# Patient Record
Sex: Male | Born: 1950 | Race: White | Hispanic: No | Marital: Married | Smoking: Never smoker
Health system: Southern US, Community
[De-identification: ages and names within clinical notes are randomized; demographics above are authoritative.]

## PROBLEM LIST (undated history)

## (undated) DIAGNOSIS — R011 Cardiac murmur, unspecified: Secondary | ICD-10-CM

## (undated) DIAGNOSIS — K219 Gastro-esophageal reflux disease without esophagitis: Secondary | ICD-10-CM

## (undated) DIAGNOSIS — I350 Nonrheumatic aortic (valve) stenosis: Secondary | ICD-10-CM

## (undated) DIAGNOSIS — I10 Essential (primary) hypertension: Secondary | ICD-10-CM

## (undated) DIAGNOSIS — T7840XA Allergy, unspecified, initial encounter: Secondary | ICD-10-CM

## (undated) DIAGNOSIS — I340 Nonrheumatic mitral (valve) insufficiency: Secondary | ICD-10-CM

## (undated) DIAGNOSIS — E785 Hyperlipidemia, unspecified: Secondary | ICD-10-CM

## (undated) HISTORY — DX: Cardiac murmur, unspecified: R01.1

## (undated) HISTORY — DX: Allergy, unspecified, initial encounter: T78.40XA

## (undated) HISTORY — DX: Gastro-esophageal reflux disease without esophagitis: K21.9

## (undated) HISTORY — PX: CARDIAC CATHETERIZATION: SHX172

## (undated) HISTORY — PX: NO PAST SURGERIES: SHX2092

## (undated) HISTORY — PX: CARDIAC VALVE REPLACEMENT: SHX585

---

## 2012-12-03 ENCOUNTER — Ambulatory Visit: Payer: Self-pay

## 2014-03-17 IMAGING — CR DG CHEST 2V
1 series · 2 of 2 positions shown · non-contrast
Comparison: none

REASON FOR EXAM: atypical chest pain
COMMENTS:   LMP: (Male)

PROCEDURE:     MDR - MDR CHEST PA(OR AP) AND LATERAL  - December 03, 2012  [DATE]
RESULT:     Comparison: None

[Series 1: pa · 0.17mm/px · 2 of 2 slices shown]
[im 1/2]
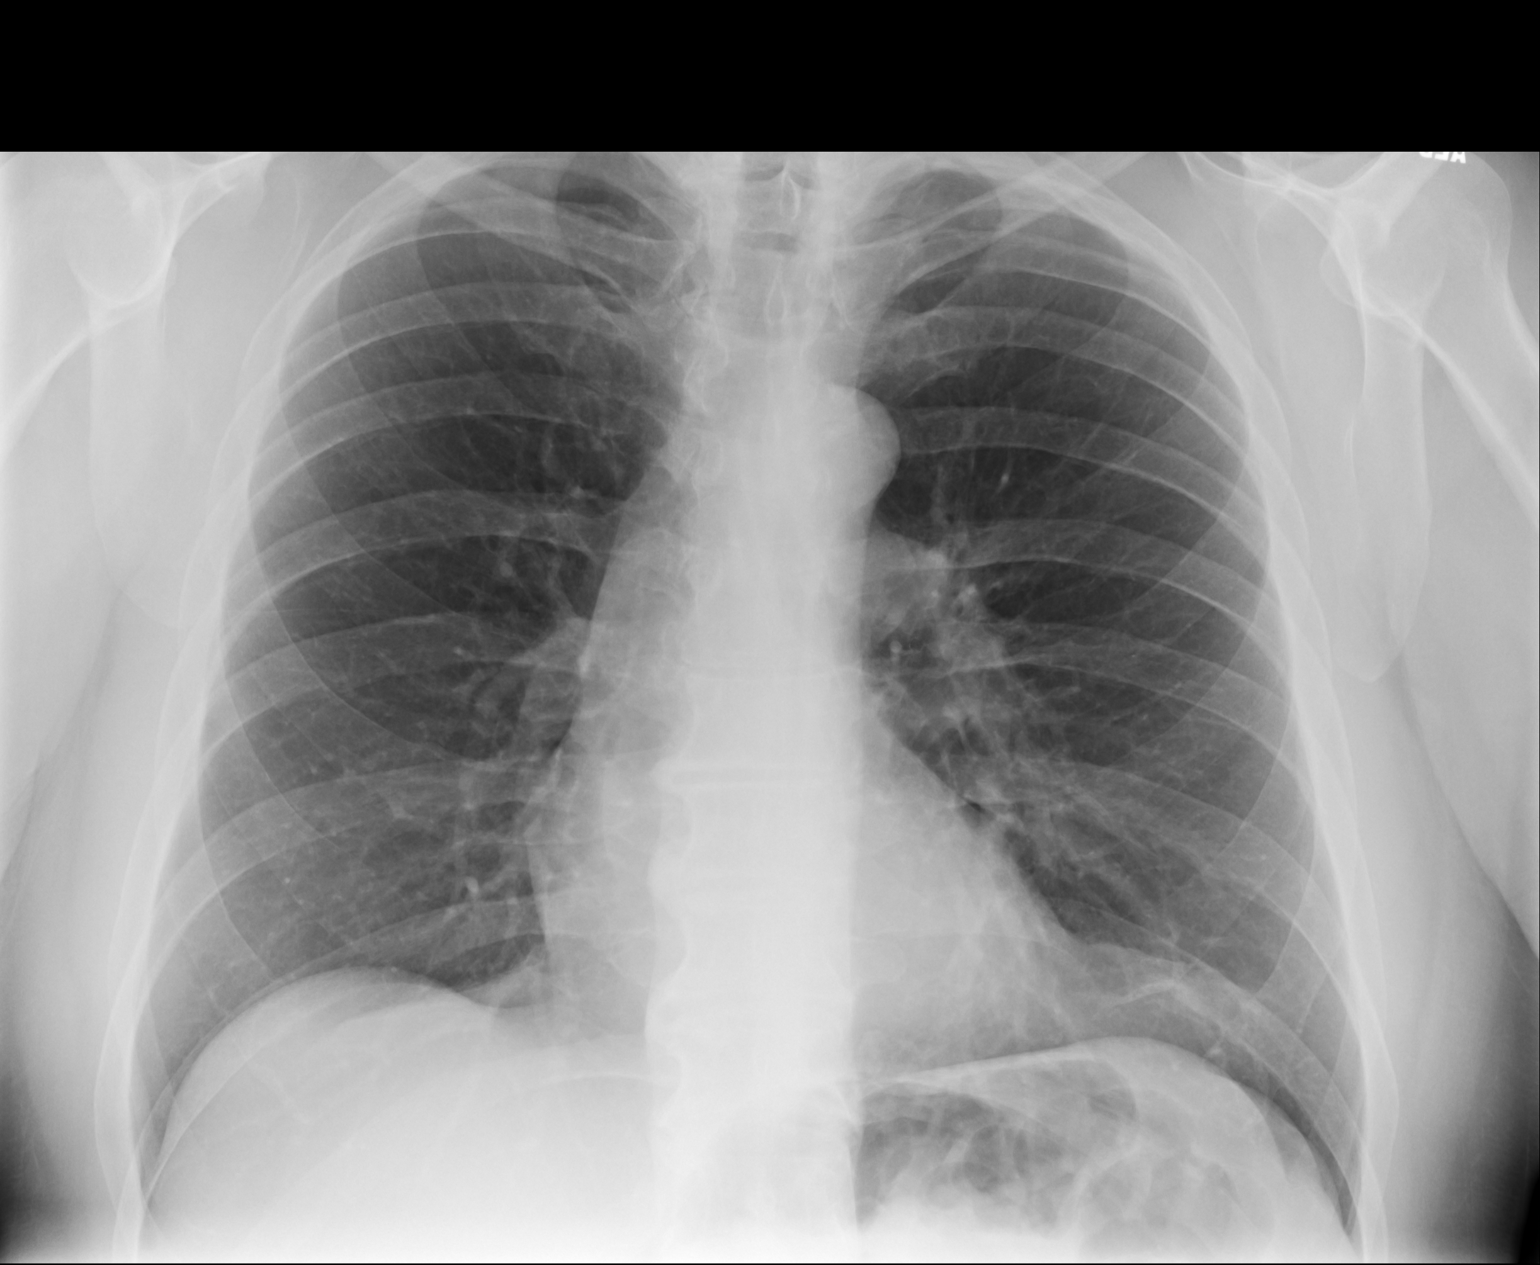
[im 2/2]
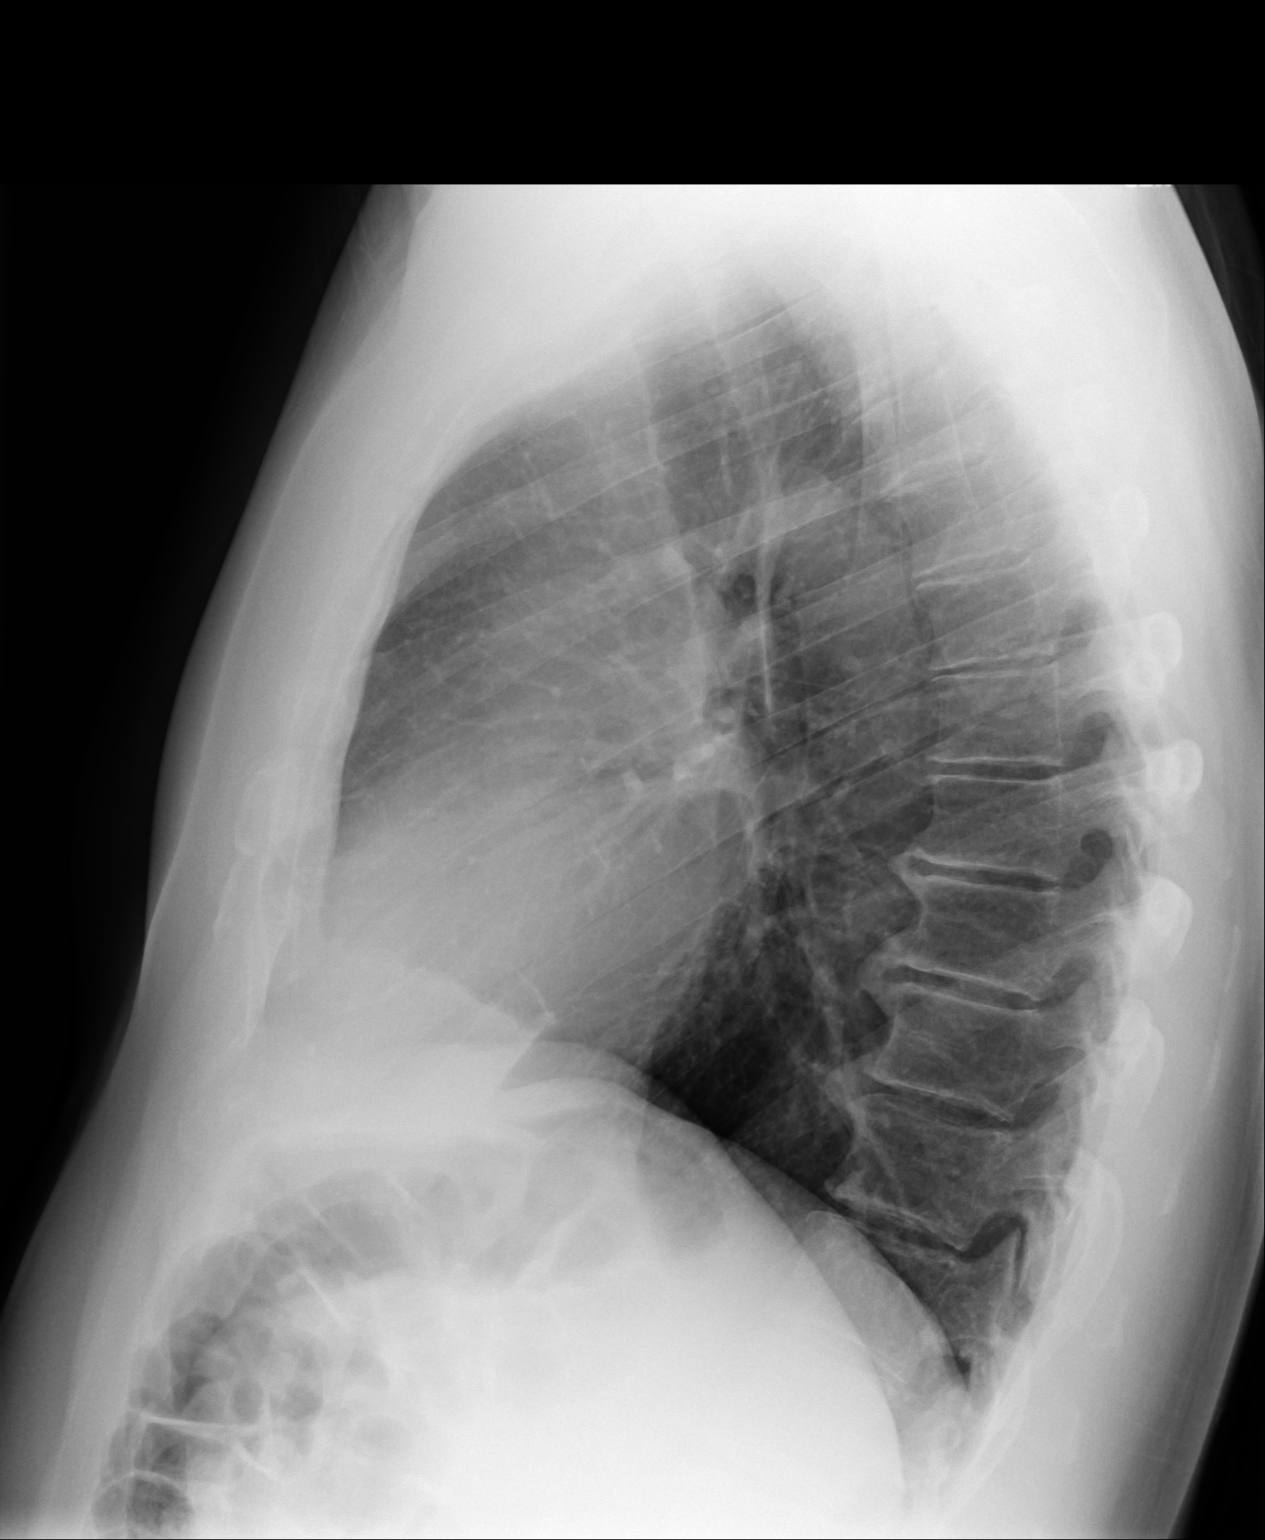

[2 of 2 positions shown; findings below may reference images not displayed]

FINDINGS: PA and lateral chest radiographs are provided.  There is no focal
parenchymal opacity, pleural effusion, or pneumothorax. The heart and
mediastinum are unremarkable.  The osseous structures are unremarkable.
IMPRESSION: No acute disease of the che[REDACTED]

## 2015-02-22 ENCOUNTER — Ambulatory Visit: Payer: BLUE CROSS/BLUE SHIELD

## 2015-02-22 ENCOUNTER — Ambulatory Visit
Admission: EM | Admit: 2015-02-22 | Discharge: 2015-02-22 | Disposition: A | Discharge status: Home / Self Care | Payer: BLUE CROSS/BLUE SHIELD | Attending: Family Medicine | Admitting: Family Medicine

## 2015-02-22 DIAGNOSIS — S61219A Laceration without foreign body of unspecified finger without damage to nail, initial encounter: Secondary | ICD-10-CM | POA: Diagnosis not present

## 2015-02-22 DIAGNOSIS — S61012A Laceration without foreign body of left thumb without damage to nail, initial encounter: Secondary | ICD-10-CM | POA: Diagnosis present

## 2015-02-22 DIAGNOSIS — W298XXA Contact with other powered powered hand tools and household machinery, initial encounter: Secondary | ICD-10-CM | POA: Insufficient documentation

## 2015-02-22 MED ORDER — TETANUS-DIPHTH-ACELL PERTUSSIS 5-2.5-18.5 LF-MCG/0.5 IM SUSP
0.5000 mL | Freq: Once | INTRAMUSCULAR | Status: AC
  Administered 2015-02-22: 0.5 mL via INTRAMUSCULAR

## 2015-02-22 MED ORDER — AMOXICILLIN 500 MG PO CAPS: 500.0000 mg | ORAL_CAPSULE | Freq: Two times a day (BID) | ORAL | Status: AC

## 2015-02-22 NOTE — Discharge Instructions (Signed)
Keep area clean and dry, follow-up with Ortho Hand as discussed, will start prophy. Antibitoic, Tylenol as needed for pain, if anything more needed for pain call our office.

## 2015-02-22 NOTE — ED Provider Notes (Signed)
Patient presents after sustaining a laceration to his left first digit when using a table saw earlier today. Patient denies any other injury. He has been putting pressure on the area and it is not actively bleeding at this time. Tetanus immunization is not up-to-date.  Review of systems negative except mentioned above. Vitals as per chart.  Gen.: No apparent distress Extremities: Left First Digit-there is approximately 0.5 inch area of skin that is avulsed from the distal aspect of the digit, there is no skin to suture together, patient has full range of motion of the digit, there does not appear to be any nail, bone, or tendon involvement, denies decreased sensation of the distal aspect of digit  A/P: Left first digit laceration-x-rays were done to see if there was any bony involvement, the wound was cleaned thoroughly, Surgicel was applied to area, amoxicillin proximal prescribed prophylactically for a week, he is to keep the wound dry and clean, I have instructed the patient to follow up with Ortho Hand or Wound Care, to continue to monitor proper healing of the area. Tylenol prn pain. Patient will contact our office if he needs anything else for pain relief.  Jolene ProvostKirtida Kenji Mapel, MD 02/24/15 [phone]

## 2015-02-22 NOTE — ED Notes (Signed)
Left thumb cleaned with normal saline and chlorhexidine soap. Adaptic dsg applied.

## 2015-02-22 NOTE — ED Notes (Signed)
Pt states "I cut my left thumb with a table saw." Thumb not actively bleeding. Pad of Left thumb avulsed. Tetanus not up to date.

## 2016-06-05 IMAGING — CR DG FINGER THUMB 2+V*L*
1 series · 3 of 3 positions shown · non-contrast
Comparison: None.

CLINICAL DATA: Laceration of the distal thumb with table saw

EXAM:
LEFT THUMB 2+V

[Series 1: pa · 0.17mm/px · 3 of 3 slices shown]
[im 1/3]
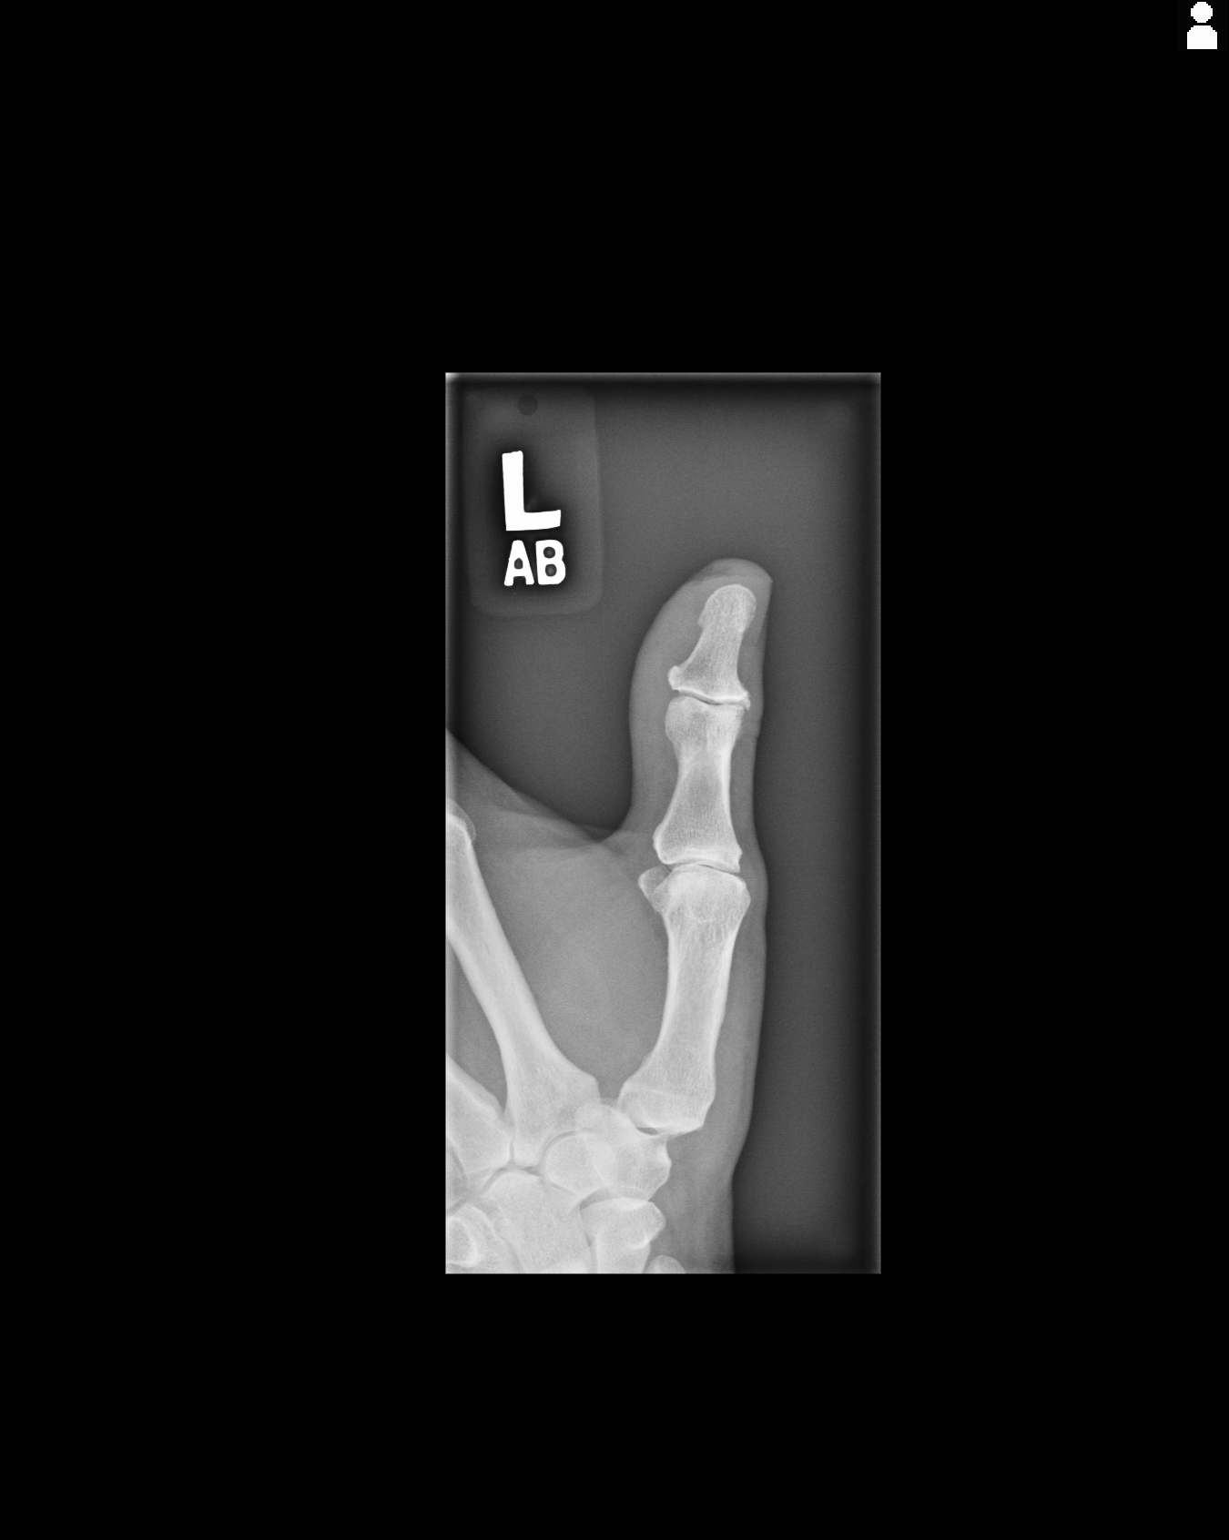
[im 2/3]
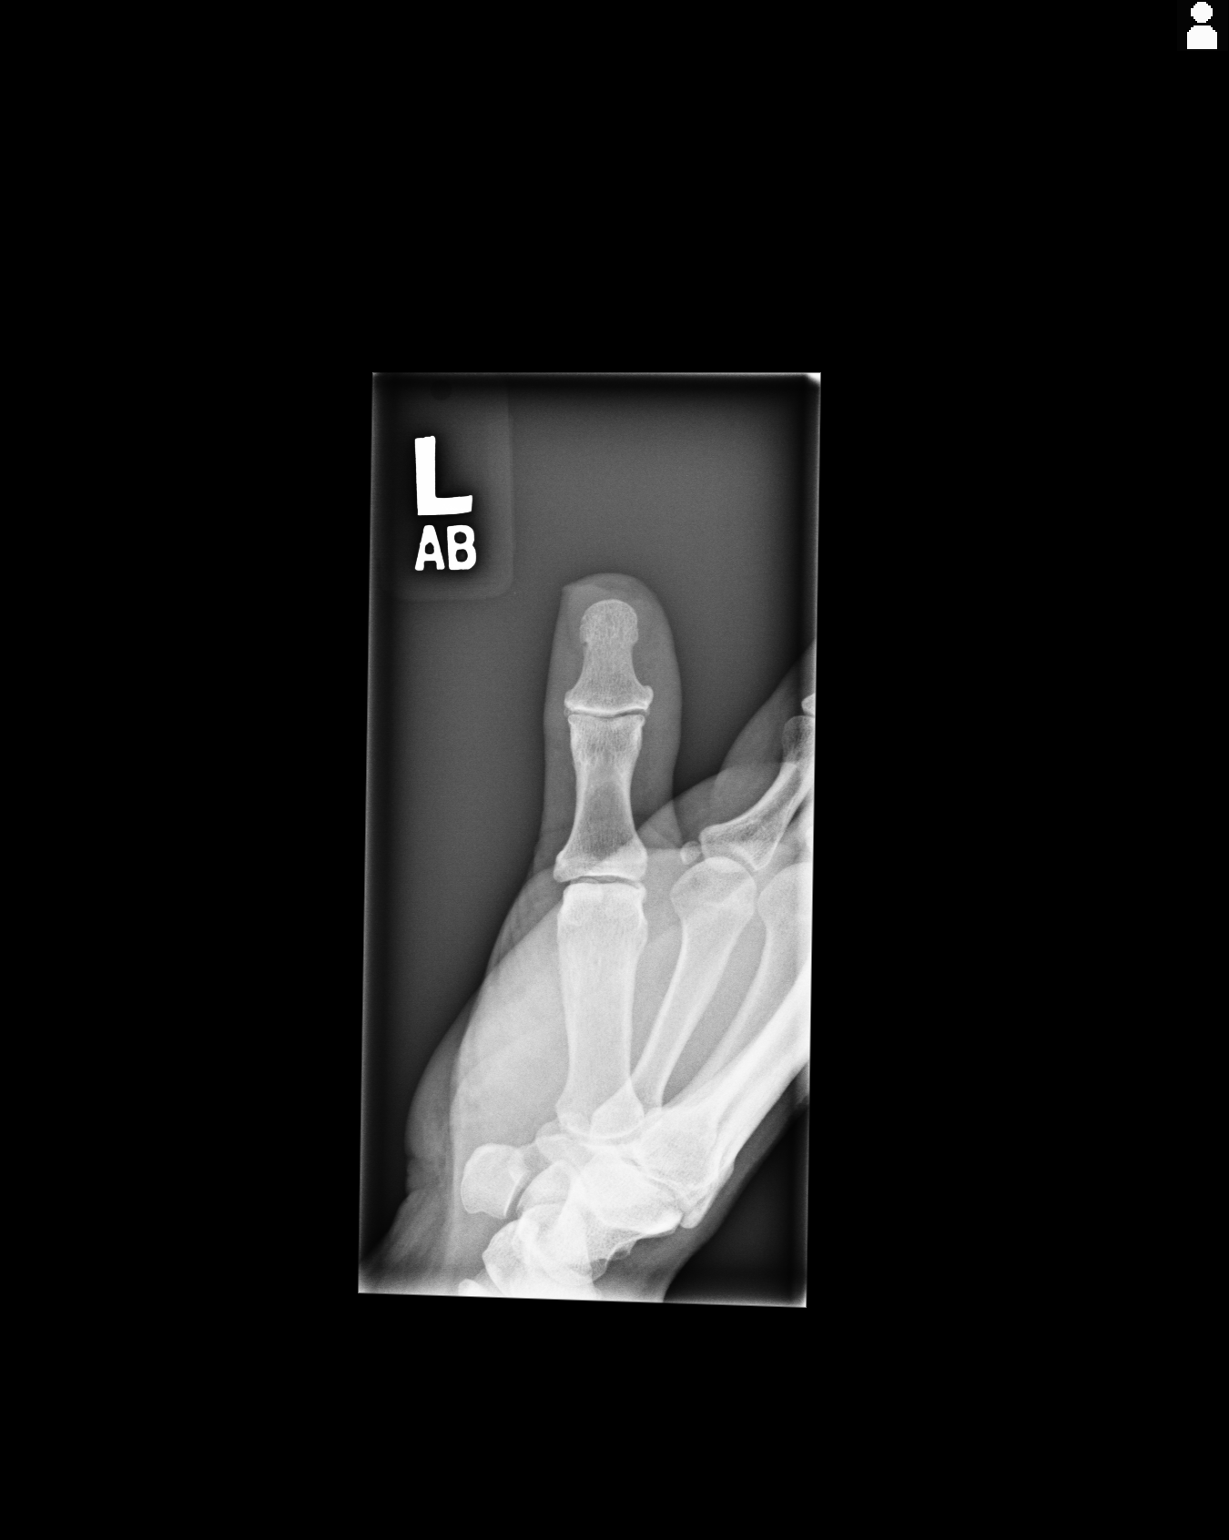
[im 3/3]
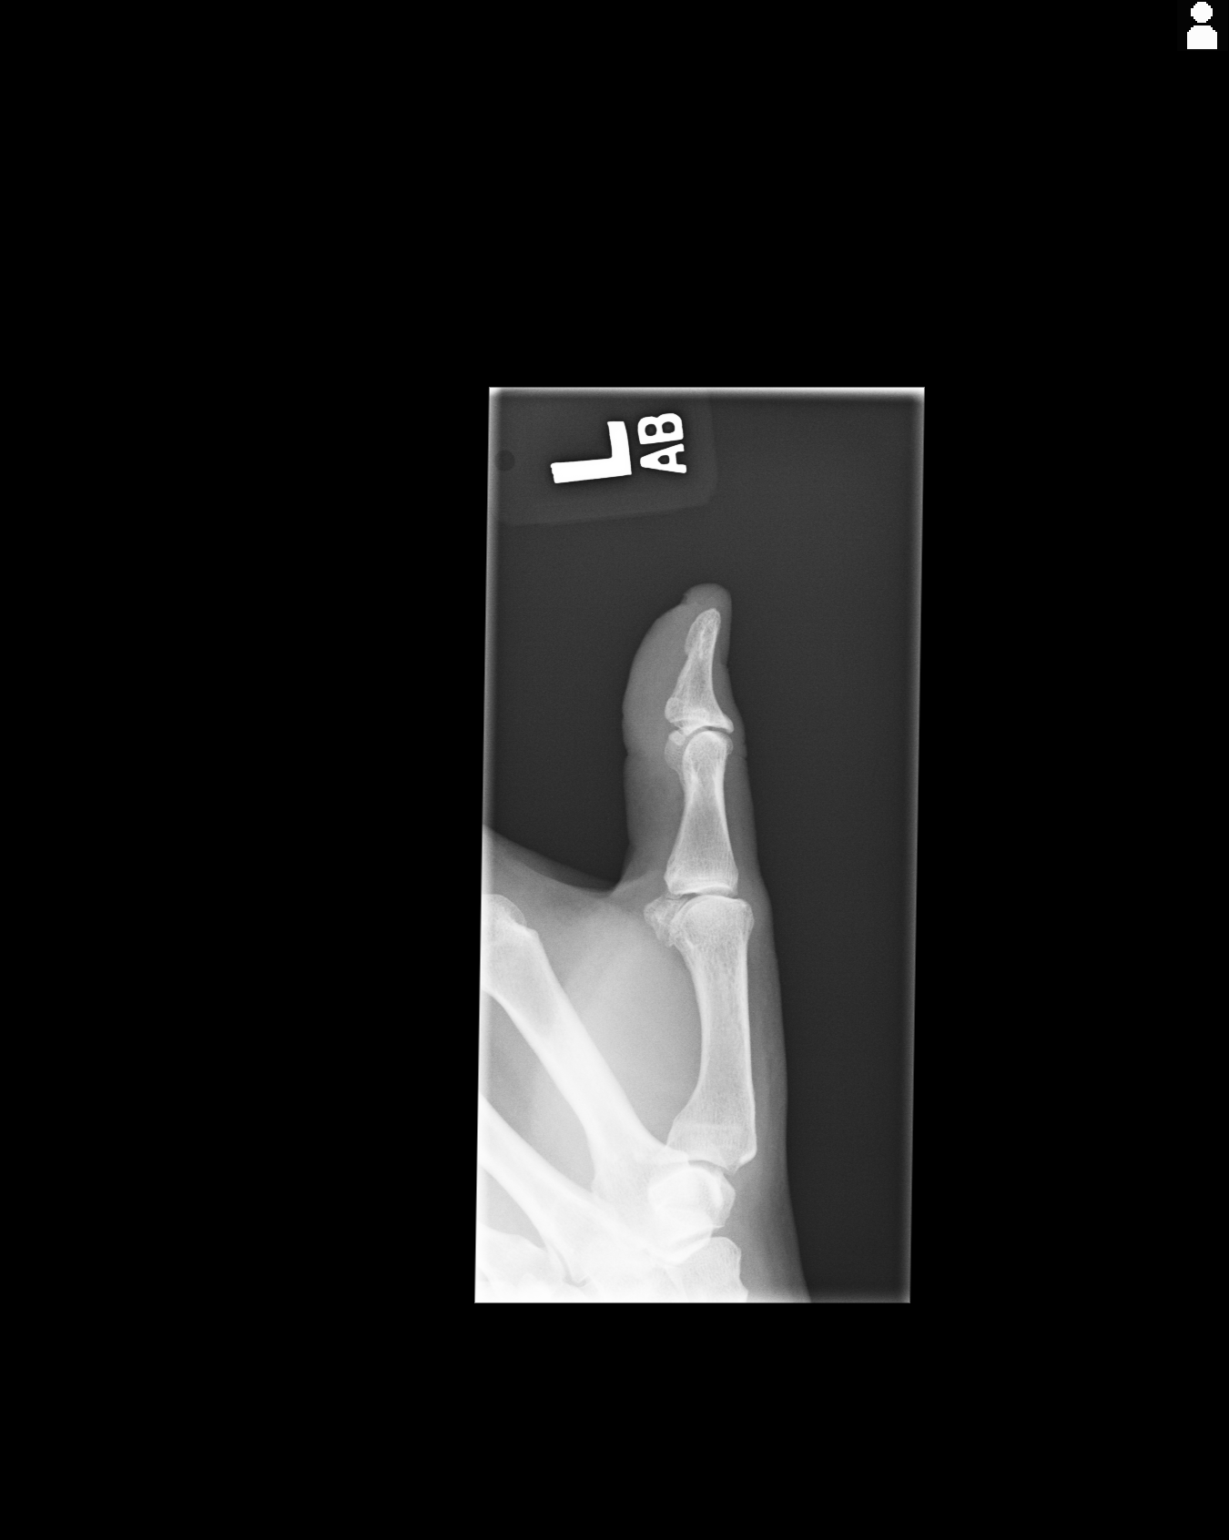

[3 of 3 positions shown; findings below may reference images not displayed]

FINDINGS: There is soft tissue defect along the distal aspect of the left
thumb on the palmar aspect. However no fracture is seen. There are
degenerative changes noted in the left first MCP and left first DIP
joints.
IMPRESSION: No acute fracture.  Degenerative changes.

## 2020-05-14 ENCOUNTER — Other Ambulatory Visit: Payer: Self-pay

## 2020-05-14 ENCOUNTER — Encounter: Payer: Self-pay | Admitting: Emergency Medicine

## 2020-05-14 ENCOUNTER — Ambulatory Visit
Admission: EM | Admit: 2020-05-14 | Discharge: 2020-05-14 | Disposition: A | Discharge status: Home / Self Care | Payer: Medicare Other | Attending: Emergency Medicine | Admitting: Emergency Medicine

## 2020-05-14 DIAGNOSIS — I10 Essential (primary) hypertension: Secondary | ICD-10-CM | POA: Insufficient documentation

## 2020-05-14 LAB — COMPREHENSIVE METABOLIC PANEL
ALT: 24 U/L (ref 0–44)
AST: 25 U/L (ref 15–41)
Albumin: 4.6 g/dL (ref 3.5–5.0)
Alkaline Phosphatase: 51 U/L (ref 38–126)
Anion gap: 7 (ref 5–15)
BUN: 14 mg/dL (ref 8–23)
CO2: 22 mmol/L (ref 22–32)
Calcium: 9.3 mg/dL (ref 8.9–10.3)
Chloride: 106 mmol/L (ref 98–111)
Creatinine, Ser: 1.05 mg/dL (ref 0.61–1.24)
GFR calc Af Amer: >60 mL/min (ref >60)
GFR calc non Af Amer: >60 mL/min (ref >60)
Glucose, Bld: 112 mg/dL — ABNORMAL HIGH (ref 70–99)
Potassium: 4.2 mmol/L (ref 3.5–5.1)
Sodium: 135 mmol/L (ref 135–145)
Total Bilirubin: 1 mg/dL (ref 0.3–1.2)
Total Protein: 7.8 g/dL (ref 6.5–8.1)

## 2020-05-14 LAB — CBC WITH DIFFERENTIAL/PLATELET
Abs Immature Granulocytes: 0.02 10*3/uL (ref 0.00–0.07)
Basophils Absolute: 0 10*3/uL (ref 0.0–0.1)
Basophils Relative: 0 %
Eosinophils Absolute: 0 10*3/uL (ref 0.0–0.5)
Eosinophils Relative: 0 %
HCT: 45.9 % (ref 39.0–52.0)
Hemoglobin: 16.1 g/dL (ref 13.0–17.0)
Immature Granulocytes: 0 %
Lymphocytes Relative: 27 %
Lymphs Abs: 2 10*3/uL (ref 0.7–4.0)
MCH: 31.6 pg (ref 26.0–34.0)
MCHC: 35.1 g/dL (ref 30.0–36.0)
MCV: 90 fL (ref 80.0–100.0)
Monocytes Absolute: 0.4 10*3/uL (ref 0.1–1.0)
Monocytes Relative: 6 %
Neutro Abs: 4.9 10*3/uL (ref 1.7–7.7)
Neutrophils Relative %: 67 %
Platelets: 261 10*3/uL (ref 150–400)
RBC: 5.1 MIL/uL (ref 4.22–5.81)
RDW: 12.7 % (ref 11.5–15.5)
WBC: 7.5 10*3/uL (ref 4.0–10.5)
nRBC: 0 % (ref 0.0–0.2)

## 2020-05-14 LAB — LIPID PANEL
Cholesterol: 329 mg/dL — ABNORMAL HIGH (ref 0–200)
HDL: 41 mg/dL (ref >40)
LDL Cholesterol: 251 mg/dL — ABNORMAL HIGH (ref 0–99)
Total CHOL/HDL Ratio: 8 RATIO
Triglycerides: 186 mg/dL — ABNORMAL HIGH (ref <150)
VLDL: 37 mg/dL (ref 0–40)

## 2020-05-14 MED ORDER — AMLODIPINE BESYLATE 5 MG PO TABS: 5.0000 mg | ORAL_TABLET | Freq: Every day | ORAL | 2 refills | Status: DC

## 2020-05-14 NOTE — ED Triage Notes (Signed)
Patient in today c/o elevated bp readings since yesterday. Patient states his normal bp is 130s/80s. Patient's bp this morning was 174/101 on his home monitor. Patient states he has never been diagnosed with HTN or been on any HTN medicines.

## 2020-05-14 NOTE — ED Provider Notes (Signed)
[facility] - [place], [place]   Name: Richard [name] DOB: May 11, 1951 MRN: [id number] CSN: [id number] PCP: Richard Limerick, MD  Arrival date and time:  05/14/20 0934  Chief Complaint:  elevated blood pressure reading   NOTE: Prior to seeing the patient today, I have reviewed the triage nursing documentation and vital signs. Clinical staff has updated patient's PMH/PSHx, current medication list, and drug allergies/intolerances to ensure comprehensive history available to assist in medical decision making.   History:   HPI: Richard [name] is a 69 y.o. male who presents today with complaints of elevated blood pressure readings at home.  Patient states he started "feeling funny" on Friday so he purchased a blood pressure monitor at his local pharmacy.  His initial blood pressure reading at home Friday evening was his "normal", 130/80's.  When he measured his blood pressure at home yesterday morning, he noticed elevated readings of 140's/80's.  Later reading recorded at 160/90.  His blood pressure this morning was 170/100. He also notes some fatigue to his left side.  He sought further care at our facility shortly after.  Patient admits he does not see a primary care provider regularly, with his last visit being "quite some while".  No known cardiac history and his biological mother; patient does not know his biological father's history.   History reviewed. No pertinent past medical history.  Past Surgical History:  Procedure Laterality Date  . NO PAST SURGERIES      Family History  Problem Relation Age of Onset  . Colon cancer Mother   . Hyperlipidemia Mother   . Other Father        unknown medical istory    Social History   Tobacco Use  . Smoking status: Never Smoker  . Smokeless tobacco: Current User    Types: Chew  Vaping Use  . Vaping Use: Never used  Substance Use Topics  . Alcohol use: Yes    Comment: occassional  . Drug use: Never    There are no  problems to display for this patient.   Home Medications:    No outpatient medications have been marked as taking for the 05/14/20 encounter [facility] Encounter).    Allergies:   Patient has no known allergies.  Review of Systems (ROS): Review of Systems  Constitutional: Positive for fatigue.  Eyes: Negative for visual disturbance.  Respiratory: Positive for chest tightness.   Cardiovascular: Negative for chest pain and palpitations.  Neurological: Negative for syncope and headaches.     Vital Signs: Today's Vitals   05/14/20 1008 05/14/20 1010 05/14/20 1013  BP:  (!) 181/87 (!) 190/100  Pulse:  76   Resp:  18   Temp:  99.3 F (37.4 C)   TempSrc:  Oral   SpO2:  99%   Weight: 195 lb (88.5 kg)    Height: 5\' 10"  (1.778 m)    PainSc: 0-No pain      Physical Exam: Physical Exam Vitals and nursing note reviewed.  Constitutional:      Appearance: Normal appearance.  Cardiovascular:     Rate and Rhythm: Normal rate.     Heart sounds: Murmur heard.  Crescendo systolic murmur is present with a grade of 4/6.   Pulmonary:     Effort: Pulmonary effort is normal.     Breath sounds: Normal breath sounds.  Neurological:     Mental Status: He is alert.      Urgent Care Treatments / Results:   LABS:  PLEASE NOTE: all labs that were ordered this encounter are listed, however only abnormal results are displayed. Labs Reviewed  CBC WITH DIFFERENTIAL/PLATELET  COMPREHENSIVE METABOLIC PANEL  LIPID PANEL    EKG: 1023: Normal sinus rhythm with a ventricular rate of 72 bpm.  Possible LVH.  RADIOLOGY: No results found.  PROCEDURES: Procedures  MEDICATIONS RECEIVED THIS VISIT: Medications - No data to display  PERTINENT CLINICAL COURSE NOTES/UPDATES:   Initial Impression / Assessment and Plan / Urgent Care Course:  Pertinent labs & imaging results that were available during my care of the patient were personally reviewed by me and considered in my medical decision  making (see lab/imaging section of note for values and interpretations).  Richard [name] is a 69 y.o. male who presents to [facility] today with complaints of elevated blood pressure readings, diagnosed with hypertension, and treated as such with the medications below. NP and patient reviewed discharge instructions below during visit.   Patient is well appearing overall in clinic today. He does not appear to be in any acute distress. Presenting symptoms (see HPI) and exam as documented above.   I have reviewed the follow up and strict return precautions for any new or worsening symptoms. Patient is aware of symptoms that would be deemed urgent/emergent, and would thus require further evaluation either here or in the emergency department. At the time of discharge, he verbalized understanding and consent with the discharge plan as it was reviewed with him. All questions were fielded by provider and/or clinic staff prior to patient discharge.    Final Clinical Impressions / Urgent Care Diagnoses:   Final diagnoses:  Hypertension, unspecified type    New Prescriptions:  [place] Controlled Substance Registry consulted? Not Applicable  Meds ordered this encounter  Medications  . amLODipine (NORVASC) 5 MG tablet    Sig: Take 1 tablet (5 mg total) by mouth daily.    Dispense:  30 tablet    Refill:  2     Discharge Instructions     You were seen for elevated blood pressure readings and are being treated for hypertension.   -Start your medication as soon as possible. -Find a primary care provider as soon as possible.  Take care, Dr. Sharlet Salina, NP-c     Recommended Follow up Care:  Patient encouraged to follow up with the following provider within the specified time frame, or sooner as dictated by the severity of his symptoms. As always, he was instructed that for any urgent/emergent care needs, he should seek care either here or in the emergency department for more immediate  evaluation.   Bailey Mech, DNP, NP-c    Bailey Mech, NP 05/14/20 [phone]

## 2020-05-14 NOTE — Discharge Instructions (Signed)
You were seen for elevated blood pressure readings and are being treated for hypertension.   -Start your medication as soon as possible. -Find a primary care provider as soon as possible.  Take care, Dr. Sharlet Salina, NP-c

## 2020-05-15 ENCOUNTER — Telehealth: Payer: Self-pay | Admitting: Family Medicine

## 2020-05-15 NOTE — Telephone Encounter (Unsigned)
Copied from CRM [id number]. Topic: General - Other >> May 15, 2020 10:48 AM Herby Abraham C wrote: Reason for CRM: pt called in for assistance. Pt says that he was seen at the hospital and told to request a referral from PCP to a cardiologist. Pt says that he wasn't told to see PCP for hospital followup   Please assist/advise.    CB: [phone]

## 2020-05-15 NOTE — Telephone Encounter (Signed)
Pt did not want to wait till fist available appt in Nov. Stated he and his wife will look for another provider.

## 2020-05-15 NOTE — Telephone Encounter (Signed)
I am sorry, but we can't give a referral for someone that is not an existing pt of ours. I looked all the way back to 2014 and he was not seen

## 2020-05-15 NOTE — Telephone Encounter (Signed)
We have not seen this pt in at least years from what I can tell? He would be a new patient

## 2020-06-15 ENCOUNTER — Ambulatory Visit: Payer: Self-pay | Admitting: Cardiovascular Disease

## 2020-06-15 DIAGNOSIS — I350 Nonrheumatic aortic (valve) stenosis: Secondary | ICD-10-CM | POA: Insufficient documentation

## 2020-06-15 MED ORDER — SODIUM CHLORIDE 0.9% FLUSH: 3.0000 mL | Freq: Two times a day (BID) | INTRAVENOUS | Status: DC

## 2020-06-15 MED ORDER — SODIUM CHLORIDE 0.9% FLUSH
3.0000 mL | Freq: Two times a day (BID) | INTRAVENOUS | Status: DC
  Filled 2020-06-15: qty 3

## 2020-06-16 ENCOUNTER — Other Ambulatory Visit
Admission: RE | Admit: 2020-06-16 | Discharge: 2020-06-16 | Disposition: A | Discharge status: Home / Self Care | Payer: Medicare Other | Source: Ambulatory Visit | Attending: Cardiovascular Disease | Admitting: Cardiovascular Disease

## 2020-06-16 ENCOUNTER — Other Ambulatory Visit: Payer: Self-pay

## 2020-06-16 DIAGNOSIS — Z01812 Encounter for preprocedural laboratory examination: Secondary | ICD-10-CM | POA: Insufficient documentation

## 2020-06-16 DIAGNOSIS — Z20822 Contact with and (suspected) exposure to covid-19: Secondary | ICD-10-CM | POA: Diagnosis not present

## 2020-06-17 LAB — SARS CORONAVIRUS 2 (TAT 6-24 HRS): SARS Coronavirus 2: NEGATIVE

## 2020-06-20 ENCOUNTER — Encounter
Admission: RE | Disposition: A | Discharge status: Home / Self Care | Payer: Self-pay | Source: Home / Self Care | Attending: Cardiovascular Disease

## 2020-06-20 ENCOUNTER — Encounter: Payer: Self-pay | Admitting: Cardiovascular Disease

## 2020-06-20 ENCOUNTER — Other Ambulatory Visit: Payer: Self-pay

## 2020-06-20 ENCOUNTER — Ambulatory Visit
Admission: RE | Admit: 2020-06-20 | Discharge: 2020-06-20 | Disposition: A | Discharge status: Home / Self Care | Payer: Medicare Other | Attending: Cardiovascular Disease | Admitting: Cardiovascular Disease

## 2020-06-20 DIAGNOSIS — I10 Essential (primary) hypertension: Secondary | ICD-10-CM | POA: Diagnosis not present

## 2020-06-20 DIAGNOSIS — F17200 Nicotine dependence, unspecified, uncomplicated: Secondary | ICD-10-CM | POA: Diagnosis not present

## 2020-06-20 DIAGNOSIS — E785 Hyperlipidemia, unspecified: Secondary | ICD-10-CM | POA: Insufficient documentation

## 2020-06-20 DIAGNOSIS — I350 Nonrheumatic aortic (valve) stenosis: Secondary | ICD-10-CM | POA: Insufficient documentation

## 2020-06-20 HISTORY — DX: Nonrheumatic aortic (valve) stenosis: I35.0

## 2020-06-20 HISTORY — PX: RIGHT/LEFT HEART CATH AND CORONARY ANGIOGRAPHY: CATH118266

## 2020-06-20 HISTORY — DX: Nonrheumatic mitral (valve) insufficiency: I34.0

## 2020-06-20 HISTORY — DX: Essential (primary) hypertension: I10

## 2020-06-20 HISTORY — DX: Hyperlipidemia, unspecified: E78.5

## 2020-06-20 SURGERY — RIGHT/LEFT HEART CATH AND CORONARY ANGIOGRAPHY
Anesthesia: Moderate Sedation | Laterality: Bilateral

## 2020-06-20 MED ORDER — SODIUM CHLORIDE 0.9% FLUSH: 3.0000 mL | Freq: Two times a day (BID) | INTRAVENOUS | Status: DC

## 2020-06-20 MED ORDER — SODIUM CHLORIDE 0.9 % IV SOLN: 250.0000 mL | INTRAVENOUS | Status: DC | PRN

## 2020-06-20 MED ORDER — SODIUM CHLORIDE 0.9 % WEIGHT BASED INFUSION: 90.0000 mL/h | INTRAVENOUS | Status: DC

## 2020-06-20 MED ORDER — FENTANYL CITRATE (PF) 100 MCG/2ML IJ SOLN
INTRAMUSCULAR | Status: DC | PRN
  Administered 2020-06-20: 50 ug via INTRAVENOUS

## 2020-06-20 MED ORDER — HEPARIN (PORCINE) IN NACL 2000-0.9 UNIT/L-% IV SOLN
INTRAVENOUS | Status: DC | PRN
  Administered 2020-06-20: 1000 mL

## 2020-06-20 MED ORDER — LIDOCAINE HCL (PF) 1 % IJ SOLN
INTRAMUSCULAR | Status: AC
  Filled 2020-06-20: qty 30

## 2020-06-20 MED ORDER — HEPARIN (PORCINE) IN NACL 1000-0.9 UT/500ML-% IV SOLN
INTRAVENOUS | Status: AC
  Filled 2020-06-20: qty 1000

## 2020-06-20 MED ORDER — MIDAZOLAM HCL 2 MG/2ML IJ SOLN
INTRAMUSCULAR | Status: DC | PRN
  Administered 2020-06-20: 1 mg via INTRAVENOUS

## 2020-06-20 MED ORDER — ONDANSETRON HCL 4 MG/2ML IJ SOLN: 4.0000 mg | Freq: Four times a day (QID) | INTRAMUSCULAR | Status: DC | PRN

## 2020-06-20 MED ORDER — SODIUM CHLORIDE 0.9 % WEIGHT BASED INFUSION
270.0000 mL/h | INTRAVENOUS | Status: DC
  Administered 2020-06-20: 270 mL/h via INTRAVENOUS

## 2020-06-20 MED ORDER — SODIUM CHLORIDE 0.9 % WEIGHT BASED INFUSION: 1.0000 mL/kg/h | INTRAVENOUS | Status: DC

## 2020-06-20 MED ORDER — LIDOCAINE HCL (PF) 1 % IJ SOLN
INTRAMUSCULAR | Status: DC | PRN
  Administered 2020-06-20: 20 mL via SUBCUTANEOUS

## 2020-06-20 MED ORDER — LABETALOL HCL 5 MG/ML IV SOLN: 10.0000 mg | INTRAVENOUS | Status: DC | PRN

## 2020-06-20 MED ORDER — ASPIRIN 81 MG PO CHEW
CHEWABLE_TABLET | ORAL | Status: AC
  Filled 2020-06-20: qty 1

## 2020-06-20 MED ORDER — MIDAZOLAM HCL 2 MG/2ML IJ SOLN
INTRAMUSCULAR | Status: AC
  Filled 2020-06-20: qty 2

## 2020-06-20 MED ORDER — FENTANYL CITRATE (PF) 100 MCG/2ML IJ SOLN
INTRAMUSCULAR | Status: AC
  Filled 2020-06-20: qty 2

## 2020-06-20 MED ORDER — ACETAMINOPHEN 325 MG PO TABS: 650.0000 mg | ORAL_TABLET | ORAL | Status: DC | PRN

## 2020-06-20 MED ORDER — IOHEXOL 300 MG/ML  SOLN
INTRAMUSCULAR | Status: DC | PRN
  Administered 2020-06-20: 68 mL

## 2020-06-20 MED ORDER — SODIUM CHLORIDE 0.9% FLUSH: 3.0000 mL | INTRAVENOUS | Status: DC | PRN

## 2020-06-20 MED ORDER — HYDRALAZINE HCL 20 MG/ML IJ SOLN: 10.0000 mg | INTRAMUSCULAR | Status: DC | PRN

## 2020-06-20 MED ORDER — ASPIRIN 81 MG PO CHEW
81.0000 mg | CHEWABLE_TABLET | ORAL | Status: AC
  Administered 2020-06-20: 81 mg via ORAL

## 2020-06-20 SURGICAL SUPPLY — 19 items
CATH INFINITI 5FR ANG PIGTAIL (CATHETERS) ×3 IMPLANT
CATH INFINITI 5FR JL4 (CATHETERS) ×3 IMPLANT
CATH INFINITI JR4 5F (CATHETERS) ×3 IMPLANT
CATH SWAN GANZ 7F STRAIGHT (CATHETERS) ×3 IMPLANT
DEVICE CLOSURE MYNXGRIP 6/7F (Vascular Products) ×3 IMPLANT
GLIDESHEATH SLEND SS 6F .021 (SHEATH) IMPLANT
GUIDEWIRE INQWIRE 1.5J.035X260 (WIRE) ×1 IMPLANT
INQWIRE 1.5J .035X260CM (WIRE) ×3
KIT MANI 3VAL PERCEP (MISCELLANEOUS) ×3 IMPLANT
NEEDLE PERC 18GX7CM (NEEDLE) ×3 IMPLANT
PACK CARDIAC CATH (CUSTOM PROCEDURE TRAY) ×3 IMPLANT
PROTECTION STATION PRESSURIZED (MISCELLANEOUS)
SET ATX SIMPLICITY (MISCELLANEOUS) ×3 IMPLANT
SHEATH AVANTI 5FR X 11CM (SHEATH) IMPLANT
SHEATH AVANTI 6FR X 11CM (SHEATH) ×3 IMPLANT
SHEATH AVANTI 7FRX11 (SHEATH) ×3 IMPLANT
STATION PROTECTION PRESSURIZED (MISCELLANEOUS) IMPLANT
WIRE EMERALD ST .035X150CM (WIRE) ×3 IMPLANT
WIRE GUIDERIGHT .035X150 (WIRE) ×3 IMPLANT

## 2020-11-13 ENCOUNTER — Encounter: Payer: Medicare Other | Attending: Cardiovascular Disease | Admitting: *Deleted

## 2020-11-13 ENCOUNTER — Other Ambulatory Visit: Payer: Self-pay

## 2020-11-13 DIAGNOSIS — Z952 Presence of prosthetic heart valve: Secondary | ICD-10-CM | POA: Insufficient documentation

## 2020-11-13 DIAGNOSIS — Z5189 Encounter for other specified aftercare: Secondary | ICD-10-CM | POA: Insufficient documentation

## 2020-11-13 NOTE — Progress Notes (Signed)
Initial telephone orientation completed. Diagnosis can be found in [facility] 12/7. EP orientation scheduled for 3/24 at 8am.

## 2020-11-16 ENCOUNTER — Other Ambulatory Visit: Payer: Self-pay

## 2020-11-16 VITALS — Ht 70.0 in | Wt 208.7 lb

## 2020-11-16 DIAGNOSIS — Z952 Presence of prosthetic heart valve: Secondary | ICD-10-CM

## 2020-11-16 DIAGNOSIS — Z5189 Encounter for other specified aftercare: Secondary | ICD-10-CM | POA: Diagnosis not present

## 2020-11-16 NOTE — Patient Instructions (Signed)
Patient Instructions  Patient Details  Name: Richard [name] MRN: [id number] Date of Birth: October 10, 1950 Referring Provider:  Laurier Nancy, MD  Below are your personal goals for exercise, nutrition, and risk factors. Our goal is to help you stay on track towards obtaining and maintaining these goals. We will be discussing your progress on these goals with you throughout the program.  Initial Exercise Prescription:  Initial Exercise Prescription - 11/16/20 0900      Date of Initial Exercise RX and Referring Provider   Date 11/16/20    Referring Provider Adrian Blackwater MD      Treadmill   MPH 2.7    Grade 0.5    Minutes 15    METs 3.25      Recumbant Bike   Level 3    RPM 60    Watts 25    Minutes 15    METs 3.2      NuStep   Level 3    SPM 80    Minutes 15    METs 3.2      T5 Nustep   Level 2    SPM 80    Minutes 15    METs 3.2      Prescription Details   Frequency (times per week) 2    Duration Progress to 30 minutes of continuous aerobic without signs/symptoms of physical distress      Intensity   THRR 40-80% of Max Heartrate 101-135    Ratings of Perceived Exertion 11-13    Perceived Dyspnea 0-4      Progression   Progression Continue to progress workloads to maintain intensity without signs/symptoms of physical distress.      Resistance Training   Training Prescription Yes    Weight 3 lb    Reps 10-15           Exercise Goals: Frequency: Be able to perform aerobic exercise two to three times per week in program working toward 2-5 days per week of home exercise.  Intensity: Work with a perceived exertion of 11 (fairly light) - 15 (hard) while following your exercise prescription.  We will make changes to your prescription with you as you progress through the program.   Duration: Be able to do 30 to 45 minutes of continuous aerobic exercise in addition to a 5 minute warm-up and a 5 minute cool-down routine.   Nutrition Goals: Your personal  nutrition goals will be established when you do your nutrition analysis with the dietician.  The following are general nutrition guidelines to follow: Cholesterol < 200mg /day Sodium < 1500mg /day Fiber: Men over 50 yrs - 30 grams per day  Personal Goals:  Personal Goals and Risk Factors at Admission - 11/16/20 0951      Core Components/Risk Factors/Patient Goals on Admission    Weight Management Yes;Weight Loss    Intervention Weight Management: Develop a combined nutrition and exercise program designed to reach desired caloric intake, while maintaining appropriate intake of nutrient and fiber, sodium and fats, and appropriate energy expenditure required for the weight goal.;Weight Management: Provide education and appropriate resources to help participant work on and attain dietary goals.;Weight Management/Obesity: Establish reasonable short term and long term weight goals.    Admit Weight 208 lb (94.3 kg)    Goal Weight: Short Term 203 lb (92.1 kg)    Goal Weight: Long Term 198 lb (89.8 kg)    Expected Outcomes Short Term: Continue to assess and modify interventions until short term weight is achieved;Long  Term: Adherence to nutrition and physical activity/exercise program aimed toward attainment of established weight goal;Weight Loss: Understanding of general recommendations for a balanced deficit meal plan, which promotes 1-2 lb weight loss per week and includes a negative energy balance of [phone] kcal/d;Understanding recommendations for meals to include 15-35% energy as protein, 25-35% energy from fat, 35-60% energy from carbohydrates, less than 200mg  of dietary cholesterol, 20-35 gm of total fiber daily;Understanding of distribution of calorie intake throughout the day with the consumption of 4-5 meals/snacks    Hypertension Yes    Intervention Provide education on lifestyle modifcations including regular physical activity/exercise, weight management, moderate sodium restriction and  increased consumption of fresh fruit, vegetables, and low fat dairy, alcohol moderation, and smoking cessation.;Monitor prescription use compliance.    Expected Outcomes Short Term: Continued assessment and intervention until BP is < 140/78mm HG in hypertensive participants. < 130/46mm HG in hypertensive participants with diabetes, heart failure or chronic kidney disease.;Long Term: Maintenance of blood pressure at goal levels.    Lipids Yes    Intervention Provide education and support for participant on nutrition & aerobic/resistive exercise along with prescribed medications to achieve LDL 70mg , HDL >40mg .    Expected Outcomes Short Term: Participant states understanding of desired cholesterol values and is compliant with medications prescribed. Participant is following exercise prescription and nutrition guidelines.;Long Term: Cholesterol controlled with medications as prescribed, with individualized exercise RX and with personalized nutrition plan. Value goals: LDL < 70mg , HDL > 40 mg.           Tobacco Use Initial Evaluation: Social History   Tobacco Use  Smoking Status Never Smoker  Smokeless Tobacco Former 91m  . Types: Chew, Snuff    Exercise Goals and Review:  Exercise Goals    Row Name 11/16/20 0950             Exercise Goals   Increase Physical Activity Yes       Intervention Provide advice, education, support and counseling about physical activity/exercise needs.;Develop an individualized exercise prescription for aerobic and resistive training based on initial evaluation findings, risk stratification, comorbidities and participant's personal goals.       Expected Outcomes Short Term: Attend rehab on a regular basis to increase amount of physical activity.;Long Term: Add in home exercise to make exercise part of routine and to increase amount of physical activity.;Long Term: Exercising regularly at least 3-5 days a week.       Increase Strength and Stamina Yes        Intervention Provide advice, education, support and counseling about physical activity/exercise needs.;Develop an individualized exercise prescription for aerobic and resistive training based on initial evaluation findings, risk stratification, comorbidities and participant's personal goals.       Expected Outcomes Short Term: Increase workloads from initial exercise prescription for resistance, speed, and METs.;Short Term: Perform resistance training exercises routinely during rehab and add in resistance training at home;Long Term: Improve cardiorespiratory fitness, muscular endurance and strength as measured by increased METs and functional capacity (Neurosurgeon)       Able to understand and use rate of perceived exertion (RPE) scale Yes       Intervention Provide education and explanation on how to use RPE scale       Expected Outcomes Short Term: Able to use RPE daily in rehab to express subjective intensity level;Long Term:  Able to use RPE to guide intensity level when exercising independently       Able to understand and use Dyspnea scale Yes  Intervention Provide education and explanation on how to use Dyspnea scale       Expected Outcomes Short Term: Able to use Dyspnea scale daily in rehab to express subjective sense of shortness of breath during exertion;Long Term: Able to use Dyspnea scale to guide intensity level when exercising independently       Knowledge and understanding of Target Heart Rate Range (THRR) Yes       Intervention Provide education and explanation of THRR including how the numbers were predicted and where they are located for reference       Expected Outcomes Short Term: Able to state/look up THRR;Short Term: Able to use daily as guideline for intensity in rehab;Long Term: Able to use THRR to govern intensity when exercising independently       Able to check pulse independently Yes       Intervention Provide education and demonstration on how to check pulse in carotid and  radial arteries.;Review the importance of being able to check your own pulse for safety during independent exercise       Expected Outcomes Short Term: Able to explain why pulse checking is important during independent exercise;Long Term: Able to check pulse independently and accurately       Understanding of Exercise Prescription Yes       Intervention Provide education, explanation, and written materials on patient's individual exercise prescription       Expected Outcomes Short Term: Able to explain program exercise prescription;Long Term: Able to explain home exercise prescription to exercise independently              Copy of goals given to participant.

## 2020-11-16 NOTE — Progress Notes (Signed)
Cardiac Individual Treatment Plan  Patient Details  Name: Richard [name] MRN: [id number] Date of Birth: 1951/01/16 Referring Provider:   Flowsheet Row Cardiac Rehab from 11/16/2020 in [facility] Cardiac and Pulmonary Rehab  Referring Provider Adrian Blackwater MD      Initial Encounter Date:  Flowsheet Row Cardiac Rehab from 11/16/2020 in [facility] Cardiac and Pulmonary Rehab  Date 11/16/20      Visit Diagnosis: S/P TAVR (transcatheter aortic valve replacement)  Patient's Home Medications on Admission:  Current Outpatient Medications:  .  amLODipine (NORVASC) 10 MG tablet, Take 1 tablet by mouth daily., Disp: , Rfl:  .  amLODipine (NORVASC) 5 MG tablet, Take 1 tablet (5 mg total) by mouth daily. (Patient not taking: Reported on 11/13/2020), Disp: 30 tablet, Rfl: 2 .  amoxicillin (AMOXIL) 500 MG capsule, Take by mouth., Disp: , Rfl:  .  aspirin 81 MG chewable tablet, Chew by mouth., Disp: , Rfl:  .  Cholecalciferol (VITAMIN D3) 50 MCG (2000 UT) TABS, Take 2,000 Units by mouth 3 (three) times a week., Disp: , Rfl:  .  Coenzyme Q10 (CO Q 10) 100 MG CAPS, Take 100 mg by mouth daily., Disp: , Rfl:  .  losartan (COZAAR) 50 MG tablet, Take 1 tablet by mouth daily., Disp: , Rfl:  .  losartan-hydrochlorothiazide (HYZAAR) 50-12.5 MG tablet, Take 1 tablet by mouth daily. (Patient not taking: Reported on 11/13/2020), Disp: , Rfl:  .  rosuvastatin (CRESTOR) 20 MG tablet, Take 20 mg by mouth at bedtime., Disp: , Rfl:  No current facility-administered medications for this visit.  Facility-Administered Medications Ordered in Other Visits:  .  sodium chloride flush (NS) 0.9 % injection 3 mL, 3 mL, Intravenous, Q12H, Laurier Nancy, MD  Past Medical History: Past Medical History:  Diagnosis Date  . Aortic stenosis   . Hyperlipidemia   . Hypertension   . Mitral valve regurgitation     Tobacco Use: Social History   Tobacco Use  Smoking Status Never Smoker  Smokeless Tobacco Former Neurosurgeon  . Types: Chew,  Snuff    Labs: Recent Review Flowsheet Data    Labs for ITP Cardiac and Pulmonary Rehab Latest Ref Rng & Units 05/14/2020   Cholestrol 0 - 200 mg/dL 914(N)   LDLCALC 0 - 99 mg/dL 829(F)   HDL >62 mg/dL 41   Trlycerides <130 mg/dL 865(H)       Exercise Target Goals: Exercise Program Goal: Individual exercise prescription set using results from initial 6 min walk test and THRR while considering  patient's activity barriers and safety.   Exercise Prescription Goal: Initial exercise prescription builds to 30-45 minutes a day of aerobic activity, 2-3 days per week.  Home exercise guidelines will be given to patient during program as part of exercise prescription that the participant will acknowledge.   Education: Aerobic Exercise: - Group verbal and visual presentation on the components of exercise prescription. Introduces F.I.T.T principle from ACSM for exercise prescriptions.  Reviews F.I.T.T. principles of aerobic exercise including progression. Written material given at graduation.   Education: Resistance Exercise: - Group verbal and visual presentation on the components of exercise prescription. Introduces F.I.T.T principle from ACSM for exercise prescriptions  Reviews F.I.T.T. principles of resistance exercise including progression. Written material given at graduation.    Education: Exercise & Equipment Safety: - Individual verbal instruction and demonstration of equipment use and safety with use of the equipment. Flowsheet Row Cardiac Rehab from 11/16/2020 in [facility] Cardiac and Pulmonary Rehab  Education need identified 11/16/20  Date 11/16/20  Educator KL  Instruction Review Code 1- Verbalizes Understanding      Education: Exercise Physiology & General Exercise Guidelines: - Group verbal and written instruction with models to review the exercise physiology of the cardiovascular system and associated critical values. Provides general exercise guidelines with specific  guidelines to those with heart or lung disease.    Education: Flexibility, Balance, Mind/Body Relaxation: - Group verbal and visual presentation with interactive activity on the components of exercise prescription. Introduces F.I.T.T principle from ACSM for exercise prescriptions. Reviews F.I.T.T. principles of flexibility and balance exercise training including progression. Also discusses the mind body connection.  Reviews various relaxation techniques to help reduce and manage stress (i.e. Deep breathing, progressive muscle relaxation, and visualization). Balance handout provided to take home. Written material given at graduation.   Activity Barriers & Risk Stratification:  Activity Barriers & Cardiac Risk Stratification - 11/16/20 0941      Activity Barriers & Cardiac Risk Stratification   Activity Barriers Deconditioning;Muscular Weakness    Cardiac Risk Stratification Moderate           6 Minute Walk:  6 Minute Walk    Row Name 11/16/20 0938         6 Minute Walk   Phase Initial     Distance 1470 feet     Walk Time 6 minutes     # of Rest Breaks 0     MPH 2.78     METS 3.26     RPE 11     Perceived Dyspnea  0     VO2 Peak 11.41     Symptoms No     Resting HR 67 bpm     Resting BP 128/68     Resting Oxygen Saturation  98 %     Exercise Oxygen Saturation  during 6 min walk 98 %     Max Ex. HR 87 bpm     Max Ex. BP 152/68     2 Minute Post BP 134/68            Oxygen Initial Assessment:   Oxygen Re-Evaluation:   Oxygen Discharge (Final Oxygen Re-Evaluation):   Initial Exercise Prescription:  Initial Exercise Prescription - 11/16/20 0900      Date of Initial Exercise RX and Referring Provider   Date 11/16/20    Referring Provider Adrian Blackwater MD      Treadmill   MPH 2.7    Grade 0.5    Minutes 15    METs 3.25      Recumbant Bike   Level 3    RPM 60    Watts 25    Minutes 15    METs 3.2      NuStep   Level 3    SPM 80    Minutes 15     METs 3.2      T5 Nustep   Level 2    SPM 80    Minutes 15    METs 3.2      Prescription Details   Frequency (times per week) 2    Duration Progress to 30 minutes of continuous aerobic without signs/symptoms of physical distress      Intensity   THRR 40-80% of Max Heartrate 101-135    Ratings of Perceived Exertion 11-13    Perceived Dyspnea 0-4      Progression   Progression Continue to progress workloads to maintain intensity without signs/symptoms of physical distress.  Resistance Training   Training Prescription Yes    Weight 3 lb    Reps 10-15           Perform Capillary Blood Glucose checks as needed.  Exercise Prescription Changes:  Exercise Prescription Changes    Row Name 11/16/20 0900             Response to Exercise   Blood Pressure (Admit) 128/68       Blood Pressure (Exercise) 152/68       Blood Pressure (Exit) 134/68       Heart Rate (Admit) 67 bpm       Heart Rate (Exercise) 87 bpm       Heart Rate (Exit) 73 bpm       Oxygen Saturation (Admit) 98 %       Oxygen Saturation (Exercise) 98 %       Oxygen Saturation (Exit) 98 %       Rating of Perceived Exertion (Exercise) 11       Perceived Dyspnea (Exercise) 0       Symptoms none       Comments walk test results              Exercise Comments:   Exercise Goals and Review:  Exercise Goals    Row Name 11/16/20 0950             Exercise Goals   Increase Physical Activity Yes       Intervention Provide advice, education, support and counseling about physical activity/exercise needs.;Develop an individualized exercise prescription for aerobic and resistive training based on initial evaluation findings, risk stratification, comorbidities and participant's personal goals.       Expected Outcomes Short Term: Attend rehab on a regular basis to increase amount of physical activity.;Long Term: Add in home exercise to make exercise part of routine and to increase amount of physical  activity.;Long Term: Exercising regularly at least 3-5 days a week.       Increase Strength and Stamina Yes       Intervention Provide advice, education, support and counseling about physical activity/exercise needs.;Develop an individualized exercise prescription for aerobic and resistive training based on initial evaluation findings, risk stratification, comorbidities and participant's personal goals.       Expected Outcomes Short Term: Increase workloads from initial exercise prescription for resistance, speed, and METs.;Short Term: Perform resistance training exercises routinely during rehab and add in resistance training at home;Long Term: Improve cardiorespiratory fitness, muscular endurance and strength as measured by increased METs and functional capacity ( )       Able to understand and use rate of perceived exertion (RPE) scale Yes       Intervention Provide education and explanation on how to use RPE scale       Expected Outcomes Short Term: Able to use RPE daily in rehab to express subjective intensity level;Long Term:  Able to use RPE to guide intensity level when exercising independently       Able to understand and use Dyspnea scale Yes       Intervention Provide education and explanation on how to use Dyspnea scale       Expected Outcomes Short Term: Able to use Dyspnea scale daily in rehab to express subjective sense of shortness of breath during exertion;Long Term: Able to use Dyspnea scale to guide intensity level when exercising independently       Knowledge and understanding of Target Heart Rate Range (THRR) Yes  Intervention Provide education and explanation of THRR including how the numbers were predicted and where they are located for reference       Expected Outcomes Short Term: Able to state/look up THRR;Short Term: Able to use daily as guideline for intensity in rehab;Long Term: Able to use THRR to govern intensity when exercising independently       Able to check  pulse independently Yes       Intervention Provide education and demonstration on how to check pulse in carotid and radial arteries.;Review the importance of being able to check your own pulse for safety during independent exercise       Expected Outcomes Short Term: Able to explain why pulse checking is important during independent exercise;Long Term: Able to check pulse independently and accurately       Understanding of Exercise Prescription Yes       Intervention Provide education, explanation, and written materials on patient's individual exercise prescription       Expected Outcomes Short Term: Able to explain program exercise prescription;Long Term: Able to explain home exercise prescription to exercise independently              Exercise Goals Re-Evaluation :   Discharge Exercise Prescription (Final Exercise Prescription Changes):  Exercise Prescription Changes - 11/16/20 0900      Response to Exercise   Blood Pressure (Admit) 128/68    Blood Pressure (Exercise) 152/68    Blood Pressure (Exit) 134/68    Heart Rate (Admit) 67 bpm    Heart Rate (Exercise) 87 bpm    Heart Rate (Exit) 73 bpm    Oxygen Saturation (Admit) 98 %    Oxygen Saturation (Exercise) 98 %    Oxygen Saturation (Exit) 98 %    Rating of Perceived Exertion (Exercise) 11    Perceived Dyspnea (Exercise) 0    Symptoms none    Comments walk test results           Nutrition:  Target Goals: Understanding of nutrition guidelines, daily intake of sodium 1500mg , cholesterol 200mg , calories 30% from fat and 7% or less from saturated fats, daily to have 5 or more servings of fruits and vegetables.  Education: All About Nutrition: -Group instruction provided by verbal, written material, interactive activities, discussions, models, and posters to present general guidelines for heart healthy nutrition including fat, fiber, MyPlate, the role of sodium in heart healthy nutrition, utilization of the nutrition label,  and utilization of this knowledge for meal planning. Follow up email sent as well. Written material given at graduation.   Biometrics:  Pre Biometrics - 11/16/20 0940      Pre Biometrics   Height  (1.778 m)    Weight 208 lb 11.2 oz (94.7 kg)    BMI (Calculated) 29.95    Single Leg Stand 30 seconds            Nutrition Therapy Plan and Nutrition Goals:   Nutrition Assessments:  MEDIFICTS Score Key:  ?70 Need to make dietary changes   40-70 Heart Healthy Diet  ? 40 Therapeutic Level Cholesterol Diet  Flowsheet Row Cardiac Rehab from 11/16/2020 in [facility] Cardiac and Pulmonary Rehab  Picture Your Plate Total Score on Admission 68     Picture Your Plate Scores:  <16 Unhealthy dietary pattern with much room for improvement.  41-50 Dietary pattern unlikely to meet recommendations for good health and room for improvement.  51-60 More healthful dietary pattern, with some room for improvement.   >60 Healthy dietary  pattern, although there may be some specific behaviors that could be improved.    Nutrition Goals Re-Evaluation:   Nutrition Goals Discharge (Final Nutrition Goals Re-Evaluation):   Psychosocial: Target Goals: Acknowledge presence or absence of significant depression and/or stress, maximize coping skills, provide positive support system. Participant is able to verbalize types and ability to use techniques and skills needed for reducing stress and depression.   Education: Stress, Anxiety, and Depression - Group verbal and visual presentation to define topics covered.  Reviews how body is impacted by stress, anxiety, and depression.  Also discusses healthy ways to reduce stress and to treat/manage anxiety and depression.  Written material given at graduation.   Education: Sleep Hygiene -Provides group verbal and written instruction about how sleep can affect your health.  Define sleep hygiene, discuss sleep cycles and impact of sleep habits. Review good  sleep hygiene tips.    Initial Review & Psychosocial Screening:  Initial Psych Review & Screening - 11/13/20 1340      Initial Review   Current issues with Current Stress Concerns    Source of Stress Concerns Unable to participate in former interests or hobbies;Unable to perform yard/household activities      Family Dynamics   Good Support System? Yes   wife     Barriers   Psychosocial barriers to participate in program There are no identifiable barriers or psychosocial needs.;The patient should benefit from training in stress management and relaxation.      Screening Interventions   Interventions Encouraged to exercise;Provide feedback about the scores to participant;To provide support and resources with identified psychosocial needs    Expected Outcomes Short Term goal: Utilizing psychosocial counselor, staff and physician to assist with identification of specific Stressors or current issues interfering with healing process. Setting desired goal for each stressor or current issue identified.;Long Term Goal: Stressors or current issues are controlled or eliminated.;Short Term goal: Identification and review with participant of any Quality of Life or Depression concerns found by scoring the questionnaire.;Long Term goal: The participant improves quality of Life and PHQ9 Scores as seen by post scores and/or verbalization of changes           Quality of Life Scores:   Quality of Life - 11/16/20 0933      Quality of Life   Select Quality of Life      Quality of Life Scores   Health/Function Pre 25.33 %    Socioeconomic Pre 24.99 %    Psych/Spiritual Pre 25.36 %    Family Pre 29.5 %    GLOBAL Pre 25.79 %          Scores of 19 and below usually indicate a poorer quality of life in these areas.  A difference of  2-3 points is a clinically meaningful difference.  A difference of 2-3 points in the total score of the Quality of Life Index has been associated with significant  improvement in overall quality of life, self-image, physical symptoms, and general health in studies assessing change in quality of life.  PHQ-9: Recent Review Flowsheet Data    Depression screen [facility] 2/9 11/16/2020   Decreased Interest 0   Down, Depressed, Hopeless 0   PHQ - 2 Score 0   Altered sleeping 0   Tired, decreased energy 1   Change in appetite 0   Feeling bad or failure about yourself  0   Trouble concentrating 0   Moving slowly or fidgety/restless 0   Suicidal thoughts 0   PHQ-9  Score 1   Difficult doing work/chores Not difficult at all     Interpretation of Total Score  Total Score Depression Severity:  1-4 = Minimal depression, 5-9 = Mild depression, 10-14 = Moderate depression, 15-19 = Moderately severe depression, 20-27 = Severe depression   Psychosocial Evaluation and Intervention:  Psychosocial Evaluation - 11/13/20 1344      Psychosocial Evaluation & Interventions   Interventions Encouraged to exercise with the program and follow exercise prescription    Comments Mr. Barca main issue post TAVR has been figuring out his blood pressure/dizziness issues. They have adjusted medications which has helped, but he still has days where he feels dizzy. His MD is aware and they are hoping with time it will adjust itself.  He states this has caused stress since he can't do different activities around the house (ex: get on a ladder and fix things). He wasn't on a lot of medication prior to his TAVR and he is trying to find a new "normal" since he didn't realize for a while that his heart was having issues before surgery. He has also noticed an increase in his tinitis which is very frustrating for him.His sleep pattern has continued to stay stable throughout recovery which he is thankful for.    Expected Outcomes Short: attend cardiac rehab for education and exercise. Long; develop positive self care habits.    Continue Psychosocial Services  Follow up required by staff            Psychosocial Re-Evaluation:   Psychosocial Discharge (Final Psychosocial Re-Evaluation):   Vocational Rehabilitation: Provide vocational rehab assistance to qualifying candidates.   Vocational Rehab Evaluation & Intervention:  Vocational Rehab - 11/13/20 1340      Initial Vocational Rehab Evaluation & Intervention   Assessment shows need for Vocational Rehabilitation No           Education: Education Goals: Education classes will be provided on a variety of topics geared toward better understanding of heart health and risk factor modification. Participant will state understanding/return demonstration of topics presented as noted by education test scores.  Learning Barriers/Preferences:  Learning Barriers/Preferences - 11/13/20 1340      Learning Barriers/Preferences   Learning Barriers None    Learning Preferences None           General Cardiac Education Topics:  AED/CPR: - Group verbal and written instruction with the use of models to demonstrate the basic use of the AED with the basic ABC's of resuscitation.   Anatomy and Cardiac Procedures: - Group verbal and visual presentation and models provide information about basic cardiac anatomy and function. Reviews the testing methods done to diagnose heart disease and the outcomes of the test results. Describes the treatment choices: Medical Management, Angioplasty, or Coronary Bypass Surgery for treating various heart conditions including Myocardial Infarction, Angina, Valve Disease, and Cardiac Arrhythmias.  Written material given at graduation.   Medication Safety: - Group verbal and visual instruction to review commonly prescribed medications for heart and lung disease. Reviews the medication, class of the drug, and side effects. Includes the steps to properly store meds and maintain the prescription regimen.  Written material given at graduation.   Intimacy: - Group verbal instruction through game format to  discuss how heart and lung disease can affect sexual intimacy. Written material given at graduation..   Know Your Numbers and Heart Failure: - Group verbal and visual instruction to discuss disease risk factors for cardiac and pulmonary disease and treatment options.  Reviews  associated critical values for Overweight/Obesity, Hypertension, Cholesterol, and Diabetes.  Discusses basics of heart failure: signs/symptoms and treatments.  Introduces Heart Failure Zone chart for action plan for heart failure.  Written material given at graduation.   Infection Prevention: - Provides verbal and written material to individual with discussion of infection control including proper hand washing and proper equipment cleaning during exercise session. Flowsheet Row Cardiac Rehab from 11/16/2020 in [facility] Cardiac and Pulmonary Rehab  Education need identified 11/16/20  Date 11/16/20  Educator KL  Instruction Review Code 1- Verbalizes Understanding      Falls Prevention: - Provides verbal and written material to individual with discussion of falls prevention and safety. Flowsheet Row Cardiac Rehab from 11/16/2020 in [facility] Cardiac and Pulmonary Rehab  Education need identified 11/16/20  Date 11/16/20  Educator KL  Instruction Review Code 1- Verbalizes Understanding      Other: -Provides group and verbal instruction on various topics (see comments)   Knowledge Questionnaire Score:  Knowledge Questionnaire Score - 11/16/20 0933      Knowledge Questionnaire Score   Pre Score 26/26           Core Components/Risk Factors/Patient Goals at Admission:  Personal Goals and Risk Factors at Admission - 11/16/20 0951      Core Components/Risk Factors/Patient Goals on Admission    Weight Management Yes;Weight Loss    Intervention Weight Management: Develop a combined nutrition and exercise program designed to reach desired caloric intake, while maintaining appropriate intake of nutrient and fiber, sodium and  fats, and appropriate energy expenditure required for the weight goal.;Weight Management: Provide education and appropriate resources to help participant work on and attain dietary goals.;Weight Management/Obesity: Establish reasonable short term and long term weight goals.    Admit Weight 208 lb (94.3 kg)    Goal Weight: Short Term 203 lb (92.1 kg)    Goal Weight: Long Term 198 lb (89.8 kg)    Expected Outcomes Short Term: Continue to assess and modify interventions until short term weight is achieved;Long Term: Adherence to nutrition and physical activity/exercise program aimed toward attainment of established weight goal;Weight Loss: Understanding of general recommendations for a balanced deficit meal plan, which promotes 1-2 lb weight loss per week and includes a negative energy balance of [phone] kcal/d;Understanding recommendations for meals to include 15-35% energy as protein, 25-35% energy from fat, 35-60% energy from carbohydrates, less than  of dietary cholesterol, 20-35 gm of total fiber daily;Understanding of distribution of calorie intake throughout the day with the consumption of 4-5 meals/snacks    Hypertension Yes    Intervention Provide education on lifestyle modifcations including regular physical activity/exercise, weight management, moderate sodium restriction and increased consumption of fresh fruit, vegetables, and low fat dairy, alcohol moderation, and smoking cessation.;Monitor prescription use compliance.    Expected Outcomes Short Term: Continued assessment and intervention until BP is < 140/33mm HG in hypertensive participants. < 130/50mm HG in hypertensive participants with diabetes, heart failure or chronic kidney disease.;Long Term: Maintenance of blood pressure at goal levels.    Lipids Yes    Intervention Provide education and support for participant on nutrition & aerobic/resistive exercise along with prescribed medications to achieve LDL 70mg , HDL >40mg .    Expected  Outcomes Short Term: Participant states understanding of desired cholesterol values and is compliant with medications prescribed. Participant is following exercise prescription and nutrition guidelines.;Long Term: Cholesterol controlled with medications as prescribed, with individualized exercise RX and with personalized nutrition plan. Value goals: LDL < , HDL > 40  mg.           Education:Diabetes - Individual verbal and written instruction to review signs/symptoms of diabetes, desired ranges of glucose level fasting, after meals and with exercise. Acknowledge that pre and post exercise glucose checks will be done for 3 sessions at entry of program.   Core Components/Risk Factors/Patient Goals Review:    Core Components/Risk Factors/Patient Goals at Discharge (Final Review):    ITP Comments:  ITP Comments    Row Name 11/13/20 1343 11/16/20 0914         ITP Comments Initial telephone orientation completed. [facility] EP orientation scheduled for 3/24 at 8am. Completed 6MWT and gym orientation. Initial ITP created and sent for review to Dr. Bethann PunchesMark Miller, Medical Director.             Comments: Initial ITP

## 2020-11-21 ENCOUNTER — Other Ambulatory Visit: Payer: Self-pay

## 2020-11-21 DIAGNOSIS — Z5189 Encounter for other specified aftercare: Secondary | ICD-10-CM | POA: Diagnosis not present

## 2020-11-21 DIAGNOSIS — Z952 Presence of prosthetic heart valve: Secondary | ICD-10-CM

## 2020-11-21 NOTE — Progress Notes (Signed)
Daily Session Note  Patient Details  Name: Richard [name] MRN: [id number] Date of Birth: Apr 20, 1951 Referring Provider:   Flowsheet Row Cardiac Rehab from 11/16/2020 in [facility] Cardiac and Pulmonary Rehab  Referring Provider Neoma Laming MD      Encounter Date: 11/21/2020  Check In:  Session Check In - 11/21/20 0751      Check-In   Supervising physician immediately available to respond to emergencies See telemetry face sheet for immediately available ER MD    Location [facility]-Cardiac & Pulmonary Rehab    Staff Present Birdie Sons, MPA, Elveria Rising, BA, ACSM CEP, Exercise Physiologist;Joseph Tessie Fass RCP,RRT,BSRT    Virtual Visit No    Medication changes reported     No    Fall or balance concerns reported    No    Warm-up and Cool-down Performed on first and last piece of equipment    Resistance Training Performed Yes    VAD Patient? No    PAD/SET Patient? No      Pain Assessment   Currently in Pain? No/denies              Social History   Tobacco Use  Smoking Status Never Smoker  Smokeless Tobacco Former Systems developer  . Types: Chew, Snuff    Goals Met:  Independence with exercise equipment Exercise tolerated well No report of cardiac concerns or symptoms Strength training completed today  Goals Unmet:  Not Applicable  Comments: First full day of exercise!  Patient was oriented to gym and equipment including functions, settings, policies, and procedures.  Patient's individual exercise prescription and treatment plan were reviewed.  All starting workloads were established based on the results of the 6 minute walk test done at initial orientation visit.  The plan for exercise progression was also introduced and progression will be customized based on patient's performance and goals.     Dr. Emily Filbert is Medical Director for [place] and [facility].

## 2020-11-23 ENCOUNTER — Other Ambulatory Visit: Payer: Self-pay

## 2020-11-23 DIAGNOSIS — Z5189 Encounter for other specified aftercare: Secondary | ICD-10-CM | POA: Diagnosis not present

## 2020-11-23 DIAGNOSIS — Z952 Presence of prosthetic heart valve: Secondary | ICD-10-CM

## 2020-11-23 NOTE — Progress Notes (Signed)
Daily Session Note  Patient Details  Name: Richard [name] MRN: [id number] Date of Birth: 03/11/1951 Referring Provider:   Flowsheet Row Cardiac Rehab from 11/16/2020 in [facility] Cardiac and Pulmonary Rehab  Referring Provider Neoma Laming MD      Encounter Date: 11/23/2020  Check In:  Session Check In - 11/23/20 0746      Check-In   Supervising physician immediately available to respond to emergencies See telemetry face sheet for immediately available ER MD    Location [facility]-Cardiac & Pulmonary Rehab    Staff Present Birdie Sons, MPA, Mauricia Area, BS, ACSM CEP, Exercise Physiologist;Colleen Mel Almond, RN, BSN    Virtual Visit No    Medication changes reported     No    Fall or balance concerns reported    No    Warm-up and Cool-down Performed on first and last piece of equipment    Resistance Training Performed Yes    VAD Patient? No    PAD/SET Patient? No      Pain Assessment   Currently in Pain? No/denies              Social History   Tobacco Use  Smoking Status Never Smoker  Smokeless Tobacco Former Systems developer  . Types: Chew, Snuff    Goals Met:  Independence with exercise equipment Exercise tolerated well No report of cardiac concerns or symptoms Strength training completed today  Goals Unmet:  Not Applicable  Comments: Pt able to follow exercise prescription today without complaint.  Will continue to monitor for progression.    Dr. Emily Filbert is Medical Director for [place] and [facility].

## 2020-11-28 ENCOUNTER — Other Ambulatory Visit: Payer: Self-pay

## 2020-11-28 ENCOUNTER — Encounter: Payer: Medicare Other | Attending: Cardiovascular Disease

## 2020-11-28 DIAGNOSIS — Z952 Presence of prosthetic heart valve: Secondary | ICD-10-CM | POA: Insufficient documentation

## 2020-11-28 NOTE — Progress Notes (Signed)
Daily Session Note  Patient Details  Name: Richard [name] MRN: [id number] Date of Birth: [birth year] Referring Provider:   Flowsheet Row Cardiac Rehab from 11/16/2020 in [facility] Cardiac and Pulmonary Rehab  Referring Provider Neoma Laming MD      Encounter Date: 11/28/2020  Check In:  Session Check In - 11/28/20 0802      Check-In   Supervising physician immediately available to respond to emergencies See telemetry face sheet for immediately available ER MD    Location [facility]-Cardiac & Pulmonary Rehab    Staff Present Birdie Sons, MPA, Elveria Rising, BA, ACSM CEP, Exercise Physiologist;Kara Eliezer Bottom, MS Exercise Physiologist    Virtual Visit No    Medication changes reported     No    Fall or balance concerns reported    No    Warm-up and Cool-down Performed on first and last piece of equipment    Resistance Training Performed Yes    VAD Patient? No    PAD/SET Patient? No      Pain Assessment   Currently in Pain? No/denies              Social History   Tobacco Use  Smoking Status Never Smoker  Smokeless Tobacco Former Systems developer  . Types: Chew, Snuff    Goals Met:  Independence with exercise equipment Exercise tolerated well No report of cardiac concerns or symptoms Strength training completed today  Goals Unmet:  Not Applicable  Comments: Pt able to follow exercise prescription today without complaint.  Will continue to monitor for progression.  Reviewed home exercise with pt today.  Pt plans to walk and use weights for exercise. We also talked about using the staff videos on bad weather days.  Reviewed THR, pulse, RPE, sign and symptoms, pulse oximetery and when to call 911 or MD.  Also discussed weather considerations and indoor options.  Pt voiced understanding.   Dr. Emily Filbert is Medical Director for [place] and [facility].

## 2020-11-30 ENCOUNTER — Other Ambulatory Visit: Payer: Self-pay

## 2020-11-30 DIAGNOSIS — Z952 Presence of prosthetic heart valve: Secondary | ICD-10-CM

## 2020-11-30 NOTE — Progress Notes (Signed)
Daily Session Note  Patient Details  Name: Richard [name] MRN: [id number] Date of Birth: 04/24/1951 Referring Provider:   Flowsheet Row Cardiac Rehab from 11/16/2020 in [facility] Cardiac and Pulmonary Rehab  Referring Provider Neoma Laming MD      Encounter Date: 11/30/2020  Check In:  Session Check In - 11/30/20 0804      Check-In   Supervising physician immediately available to respond to emergencies See telemetry face sheet for immediately available ER MD    Location [facility]-Cardiac & Pulmonary Rehab    Staff Present Birdie Sons, MPA, Mauricia Area, BS, ACSM CEP, Exercise Physiologist;Amanda Oletta Darter, BA, ACSM CEP, Exercise Physiologist    Virtual Visit No    Medication changes reported     No    Fall or balance concerns reported    No    Warm-up and Cool-down Performed on first and last piece of equipment    Resistance Training Performed Yes    VAD Patient? No    PAD/SET Patient? No      Pain Assessment   Currently in Pain? No/denies              Social History   Tobacco Use  Smoking Status Never Smoker  Smokeless Tobacco Former Systems developer  . Types: Chew, Snuff    Goals Met:  Independence with exercise equipment Exercise tolerated well No report of cardiac concerns or symptoms Strength training completed today  Goals Unmet:  Not Applicable  Comments: Pt able to follow exercise prescription today without complaint.  Will continue to monitor for progression.    Dr. Emily Filbert is Medical Director for [place] and [facility].

## 2020-12-05 ENCOUNTER — Other Ambulatory Visit: Payer: Self-pay

## 2020-12-05 DIAGNOSIS — Z952 Presence of prosthetic heart valve: Secondary | ICD-10-CM

## 2020-12-05 NOTE — Progress Notes (Signed)
Daily Session Note  Patient Details  Name: Richard [name] MRN: [id number] Date of Birth: Oct 20, 1950 Referring Provider:   Flowsheet Row Cardiac Rehab from 11/16/2020 in [facility] Cardiac and Pulmonary Rehab  Referring Provider Neoma Laming MD      Encounter Date: 12/05/2020  Check In:  Session Check In - 12/05/20 0802      Check-In   Supervising physician immediately available to respond to emergencies See telemetry face sheet for immediately available ER MD    Location [facility]-Cardiac & Pulmonary Rehab    Staff Present Birdie Sons, MPA, Elveria Rising, BA, ACSM CEP, Exercise Physiologist;Kara Eliezer Bottom, MS Exercise Physiologist    Virtual Visit No    Medication changes reported     No    Fall or balance concerns reported    No    Warm-up and Cool-down Performed on first and last piece of equipment    Resistance Training Performed Yes    VAD Patient? No    PAD/SET Patient? No      Pain Assessment   Currently in Pain? No/denies              Social History   Tobacco Use  Smoking Status Never Smoker  Smokeless Tobacco Former Systems developer  . Types: Chew, Snuff    Goals Met:  Independence with exercise equipment Exercise tolerated well No report of cardiac concerns or symptoms Strength training completed today  Goals Unmet:  Not Applicable  Comments: Pt able to follow exercise prescription today without complaint.  Will continue to monitor for progression.    Dr. Emily Filbert is Medical Director for [place] and [facility].

## 2020-12-07 ENCOUNTER — Other Ambulatory Visit: Payer: Self-pay

## 2020-12-07 DIAGNOSIS — Z952 Presence of prosthetic heart valve: Secondary | ICD-10-CM

## 2020-12-07 NOTE — Progress Notes (Signed)
Daily Session Note  Patient Details  Name: Richard [name] MRN: [id number] Date of Birth: 11-10-1950 Referring Provider:   Flowsheet Row Cardiac Rehab from 11/16/2020 in [facility] Card[facility] Rehab  Referring Provider Neoma Laming MD      Encounter Date: 12/07/2020  Check In:  Session Check In - 12/07/20 0723      Check-In   Supervising physician immediately available to respond to emergencies See telemetry face sheet for immediately available ER MD    Location [facility]-Cardiac & Pulmonary Rehab    Staff Present Birdie Sons, MPA, Mauricia Area, BS, ACSM CEP, Exercise Physiologist;Amanda Oletta Darter, BA, ACSM CEP, Exercise Physiologist    Virtual Visit No    Medication changes reported     No    Fall or balance concerns reported    No    Warm-up and Cool-down Performed on first and last piece of equipment    Resistance Training Performed Yes    VAD Patient? No    PAD/SET Patient? No      Pain Assessment   Currently in Pain? No/denies              Social History   Tobacco Use  Smoking Status Never Smoker  Smokeless Tobacco Former Systems developer  . Types: Chew, Snuff    Goals Met:  Independence with exercise equipment Exercise tolerated well No report of cardiac concerns or symptoms Strength training completed today  Goals Unmet:  Not Applicable  Comments: Pt able to follow exercise prescription today without complaint.  Will continue to monitor for progression.    Dr. Emily Filbert is Medical Director for [place] and [facility].

## 2020-12-12 ENCOUNTER — Encounter: Payer: Medicare Other | Admitting: *Deleted

## 2020-12-12 ENCOUNTER — Other Ambulatory Visit: Payer: Self-pay

## 2020-12-12 DIAGNOSIS — Z952 Presence of prosthetic heart valve: Secondary | ICD-10-CM

## 2020-12-12 NOTE — Progress Notes (Signed)
Daily Session Note  Patient Details  Name: Richard [name] MRN: [id number] Date of Birth: December 09, 1950 Referring Provider:   Flowsheet Row Cardiac Rehab from 11/16/2020 in [facility] Cardiac and Pulmonary Rehab  Referring Provider Neoma Laming MD      Encounter Date: 12/12/2020  Check In:  Session Check In - 12/12/20 0940      Check-In   Supervising physician immediately available to respond to emergencies See telemetry face sheet for immediately available ER MD    Location [facility]-Cardiac & Pulmonary Rehab    Staff Present Heath Lark, RN, BSN, CCRP;Melissa Caiola RDN, LDN;Laureen Owens Shark, BS, RRT, CPFT    Virtual Visit No    Medication changes reported     No    Fall or balance concerns reported    No    Warm-up and Cool-down Performed on first and last piece of equipment    Resistance Training Performed Yes    VAD Patient? No    PAD/SET Patient? No      Pain Assessment   Currently in Pain? No/denies              Social History   Tobacco Use  Smoking Status Never Smoker  Smokeless Tobacco Former Systems developer  . Types: Chew, Snuff    Goals Met:  Independence with exercise equipment Exercise tolerated well No report of cardiac concerns or symptoms  Goals Unmet:  Not Applicable  Comments: Pt able to follow exercise prescription today without complaint.  Will continue to monitor for progression.    Dr. Emily Filbert is Medical Director for [place] and [facility].

## 2020-12-13 ENCOUNTER — Encounter: Payer: Self-pay | Admitting: *Deleted

## 2020-12-13 DIAGNOSIS — Z952 Presence of prosthetic heart valve: Secondary | ICD-10-CM

## 2020-12-13 NOTE — Progress Notes (Signed)
Cardiac Individual Treatment Plan  Patient Details  Name: Richard [name] MRN: [id number] Date of Birth: 1951-06-30 Referring Provider:   Flowsheet Row Cardiac Rehab from 11/16/2020 in [facility] Cardiac and Pulmonary Rehab  Referring Provider Adrian Blackwater MD      Initial Encounter Date:  Flowsheet Row Cardiac Rehab from 11/16/2020 in [facility] Cardiac and Pulmonary Rehab  Date 11/16/20      Visit Diagnosis: S/P TAVR (transcatheter aortic valve replacement)  Patient's Home Medications on Admission:  Current Outpatient Medications:  .  amLODipine (NORVASC) 10 MG tablet, Take 1 tablet by mouth daily., Disp: , Rfl:  .  amLODipine (NORVASC) 5 MG tablet, Take 1 tablet (5 mg total) by mouth daily. (Patient not taking: Reported on 11/13/2020), Disp: 30 tablet, Rfl: 2 .  amoxicillin (AMOXIL) 500 MG capsule, Take by mouth., Disp: , Rfl:  .  aspirin 81 MG chewable tablet, Chew by mouth., Disp: , Rfl:  .  Cholecalciferol (VITAMIN D3) 50 MCG (2000 UT) TABS, Take 2,000 Units by mouth 3 (three) times a week., Disp: , Rfl:  .  Coenzyme Q10 (CO Q 10) 100 MG CAPS, Take 100 mg by mouth daily., Disp: , Rfl:  .  losartan (COZAAR) 50 MG tablet, Take 1 tablet by mouth daily., Disp: , Rfl:  .  losartan-hydrochlorothiazide (HYZAAR) 50-12.5 MG tablet, Take 1 tablet by mouth daily. (Patient not taking: Reported on 11/13/2020), Disp: , Rfl:  .  rosuvastatin (CRESTOR) 20 MG tablet, Take 20 mg by mouth at bedtime., Disp: , Rfl:  No current facility-administered medications for this visit.  Facility-Administered Medications Ordered in Other Visits:  .  sodium chloride flush (NS) 0.9 % injection 3 mL, 3 mL, Intravenous, Q12H, Laurier Nancy, MD  Past Medical History: Past Medical History:  Diagnosis Date  . Aortic stenosis   . Hyperlipidemia   . Hypertension   . Mitral valve regurgitation     Tobacco Use: Social History   Tobacco Use  Smoking Status Never Smoker  Smokeless Tobacco Former Neurosurgeon  . Types: Chew,  Snuff    Labs: Recent Review Flowsheet Data    Labs for ITP Cardiac and Pulmonary Rehab Latest Ref Rng & Units 05/14/2020   Cholestrol 0 - 200 mg/dL 086(V)   LDLCALC 0 - 99 mg/dL 784(O)   HDL >96 mg/dL 41   Trlycerides <295 mg/dL 284(X)       Exercise Target Goals: Exercise Program Goal: Individual exercise prescription set using results from initial 6 min walk test and THRR while considering  patient's activity barriers and safety.   Exercise Prescription Goal: Initial exercise prescription builds to 30-45 minutes a day of aerobic activity, 2-3 days per week.  Home exercise guidelines will be given to patient during program as part of exercise prescription that the participant will acknowledge.   Education: Aerobic Exercise: - Group verbal and visual presentation on the components of exercise prescription. Introduces F.I.T.T principle from ACSM for exercise prescriptions.  Reviews F.I.T.T. principles of aerobic exercise including progression. Written material given at graduation.   Education: Resistance Exercise: - Group verbal and visual presentation on the components of exercise prescription. Introduces F.I.T.T principle from ACSM for exercise prescriptions  Reviews F.I.T.T. principles of resistance exercise including progression. Written material given at graduation.    Education: Exercise & Equipment Safety: - Individual verbal instruction and demonstration of equipment use and safety with use of the equipment. Flowsheet Row Cardiac Rehab from 11/30/2020 in [facility] Cardiac and Pulmonary Rehab  Education need identified 11/16/20  Date 11/16/20  Educator KL  Instruction Review Code 1- Verbalizes Understanding      Education: Exercise Physiology & General Exercise Guidelines: - Group verbal and written instruction with models to review the exercise physiology of the cardiovascular system and associated critical values. Provides general exercise guidelines with specific guidelines  to those with heart or lung disease.    Education: Flexibility, Balance, Mind/Body Relaxation: - Group verbal and visual presentation with interactive activity on the components of exercise prescription. Introduces F.I.T.T principle from ACSM for exercise prescriptions. Reviews F.I.T.T. principles of flexibility and balance exercise training including progression. Also discusses the mind body connection.  Reviews various relaxation techniques to help reduce and manage stress (i.e. Deep breathing, progressive muscle relaxation, and visualization). Balance handout provided to take home. Written material given at graduation.   Activity Barriers & Risk Stratification:  Activity Barriers & Cardiac Risk Stratification - 11/16/20 0941      Activity Barriers & Cardiac Risk Stratification   Activity Barriers Deconditioning;Muscular Weakness    Cardiac Risk Stratification Moderate           6 Minute Walk:  6 Minute Walk    Row Name 11/16/20 0938         6 Minute Walk   Phase Initial     Distance 1470 feet     Walk Time 6 minutes     # of Rest Breaks 0     MPH 2.78     METS 3.26     RPE 11     Perceived Dyspnea  0     VO2 Peak 11.41     Symptoms No     Resting HR 67 bpm     Resting BP 128/68     Resting Oxygen Saturation  98 %     Exercise Oxygen Saturation  during 6 min walk 98 %     Max Ex. HR 87 bpm     Max Ex. BP 152/68     2 Minute Post BP 134/68            Oxygen Initial Assessment:   Oxygen Re-Evaluation:   Oxygen Discharge (Final Oxygen Re-Evaluation):   Initial Exercise Prescription:  Initial Exercise Prescription - 11/16/20 0900      Date of Initial Exercise RX and Referring Provider   Date 11/16/20    Referring Provider Adrian Blackwater MD      Treadmill   MPH 2.7    Grade 0.5    Minutes 15    METs 3.25      Recumbant Bike   Level 3    RPM 60    Watts 25    Minutes 15    METs 3.2      NuStep   Level 3    SPM 80    Minutes 15    METs 3.2       T5 Nustep   Level 2    SPM 80    Minutes 15    METs 3.2      Prescription Details   Frequency (times per week) 2    Duration Progress to 30 minutes of continuous aerobic without signs/symptoms of physical distress      Intensity   THRR 40-80% of Max Heartrate 101-135    Ratings of Perceived Exertion 11-13    Perceived Dyspnea 0-4      Progression   Progression Continue to progress workloads to maintain intensity without signs/symptoms of physical distress.  Resistance Training   Training Prescription Yes    Weight 3 lb    Reps 10-15           Perform Capillary Blood Glucose checks as needed.  Exercise Prescription Changes:  Exercise Prescription Changes    Row Name 11/16/20 0900 11/21/20 1600 11/28/20 0800 12/05/20 1500       Response to Exercise   Blood Pressure (Admit) 128/68 142/70 -- 138/66    Blood Pressure (Exercise) 152/68 142/64 -- 144/68    Blood Pressure (Exit) 134/68 126/68 -- 124/68    Heart Rate (Admit) 67 bpm 80 bpm -- 77 bpm    Heart Rate (Exercise) 87 bpm 102 bpm -- 105 bpm    Heart Rate (Exit) 73 bpm 85 bpm -- 84 bpm    Oxygen Saturation (Admit) 98 % -- -- --    Oxygen Saturation (Exercise) 98 % -- -- --    Oxygen Saturation (Exit) 98 % -- -- --    Rating of Perceived Exertion (Exercise) 11 13 -- 13    Perceived Dyspnea (Exercise) 0 -- -- --    Symptoms none none -- none    Comments walk test results first full day of exercise -- --    Duration -- Progress to 30 minutes of  aerobic without signs/symptoms of physical distress -- Continue with 30 min of aerobic exercise without signs/symptoms of physical distress.    Intensity -- THRR unchanged -- THRR unchanged         Progression   Progression -- Continue to progress workloads to maintain intensity without signs/symptoms of physical distress. -- Continue to progress workloads to maintain intensity without signs/symptoms of physical distress.    Average METs -- 2.63 -- 2.91          Resistance Training   Training Prescription -- Yes -- Yes    Weight -- 3 lb -- 3 lb    Reps -- 10-15 -- 10-15         Interval Training   Interval Training -- No -- No         Treadmill   MPH -- 2.7 -- 2.9    Grade -- 0.5 -- 0.5    Minutes -- 15 -- 15    METs -- 3.25 -- 3.42         NuStep   Level -- -- -- 4    SPM -- -- -- 80    Minutes -- -- -- 15    METs -- -- -- 2.4         T5 Nustep   Level -- 3 -- --    Minutes -- 15 -- --    METs -- 2 -- --         Home Exercise Plan   Plans to continue exercise at -- -- Home (comment)  walking, staff videos Home (comment)  walking, staff videos    Frequency -- -- Add 3 additional days to program exercise sessions. Add 3 additional days to program exercise sessions.    Initial Home Exercises Provided -- -- 11/28/20 11/28/20           Exercise Comments:  Exercise Comments    Row Name 11/21/20 [phone]           Exercise Comments First full day of exercise!  Patient was oriented to gym and equipment including functions, settings, policies, and procedures.  Patient's individual exercise prescription and treatment plan were reviewed.  All starting workloads were established based  on the results of the 6 minute walk test done at initial orientation visit.  The plan for exercise progression was also introduced and progression will be customized based on patient's performance and goals.              Exercise Goals and Review:  Exercise Goals    Row Name 11/16/20 0950             Exercise Goals   Increase Physical Activity Yes       Intervention Provide advice, education, support and counseling about physical activity/exercise needs.;Develop an individualized exercise prescription for aerobic and resistive training based on initial evaluation findings, risk stratification, comorbidities and participant's personal goals.       Expected Outcomes Short Term: Attend rehab on a regular basis to increase amount of physical  activity.;Long Term: Add in home exercise to make exercise part of routine and to increase amount of physical activity.;Long Term: Exercising regularly at least 3-5 days a week.       Increase Strength and Stamina Yes       Intervention Provide advice, education, support and counseling about physical activity/exercise needs.;Develop an individualized exercise prescription for aerobic and resistive training based on initial evaluation findings, risk stratification, comorbidities and participant's personal goals.       Expected Outcomes Short Term: Increase workloads from initial exercise prescription for resistance, speed, and METs.;Short Term: Perform resistance training exercises routinely during rehab and add in resistance training at home;Long Term: Improve cardiorespiratory fitness, muscular endurance and strength as measured by increased METs and functional capacity ( )       Able to understand and use rate of perceived exertion (RPE) scale Yes       Intervention Provide education and explanation on how to use RPE scale       Expected Outcomes Short Term: Able to use RPE daily in rehab to express subjective intensity level;Long Term:  Able to use RPE to guide intensity level when exercising independently       Able to understand and use Dyspnea scale Yes       Intervention Provide education and explanation on how to use Dyspnea scale       Expected Outcomes Short Term: Able to use Dyspnea scale daily in rehab to express subjective sense of shortness of breath during exertion;Long Term: Able to use Dyspnea scale to guide intensity level when exercising independently       Knowledge and understanding of Target Heart Rate Range (THRR) Yes       Intervention Provide education and explanation of THRR including how the numbers were predicted and where they are located for reference       Expected Outcomes Short Term: Able to state/look up THRR;Short Term: Able to use daily as guideline for intensity in  rehab;Long Term: Able to use THRR to govern intensity when exercising independently       Able to check pulse independently Yes       Intervention Provide education and demonstration on how to check pulse in carotid and radial arteries.;Review the importance of being able to check your own pulse for safety during independent exercise       Expected Outcomes Short Term: Able to explain why pulse checking is important during independent exercise;Long Term: Able to check pulse independently and accurately       Understanding of Exercise Prescription Yes       Intervention Provide education, explanation, and written materials on patient's individual exercise prescription  Expected Outcomes Short Term: Able to explain program exercise prescription;Long Term: Able to explain home exercise prescription to exercise independently              Exercise Goals Re-Evaluation :  Exercise Goals Re-Evaluation    Row Name 11/21/20 0752 11/28/20 0812 12/05/20 1518         Exercise Goal Re-Evaluation   Exercise Goals Review Increase Physical Activity;Able to understand and use rate of perceived exertion (RPE) scale;Knowledge and understanding of Target Heart Rate Range (THRR);Understanding of Exercise Prescription;Increase Strength and Stamina;Able to understand and use Dyspnea scale;Able to check pulse independently Increase Physical Activity;Increase Strength and Stamina;Understanding of Exercise Prescription;Able to understand and use rate of perceived exertion (RPE) scale;Able to understand and use Dyspnea scale;Knowledge and understanding of Target Heart Rate Range (THRR);Able to check pulse independently Increase Physical Activity;Increase Strength and Stamina     Comments Reviewed RPE and dyspnea scales, THR and program prescription with pt today.  Pt voiced understanding and was given a copy of goals to take home. Reviewed home exercise with pt today.  Pt plans to walk and use weights for exercise. We  also talked about using the staff videos on bad weather days.  Reviewed THR, pulse, RPE, sign and symptoms, pulse oximetery and when to call 911 or MD.  Also discussed weather considerations and indoor options.  Pt voiced understanding. Richard NovemberMike is progressing well with exercise. He has increased levels on TM and seated machines.  Staff wil lmonitor progress.     Expected Outcomes Short: Use RPE daily to regulate intensity. Long: Follow program prescription in THR. Short: Start to add in exercise at home Long; Continue to follow program prescription Short: continue to attend consistently Long:  continue to build stamina            Discharge Exercise Prescription (Final Exercise Prescription Changes):  Exercise Prescription Changes - 12/05/20 1500      Response to Exercise   Blood Pressure (Admit) 138/66    Blood Pressure (Exercise) 144/68    Blood Pressure (Exit) 124/68    Heart Rate (Admit) 77 bpm    Heart Rate (Exercise) 105 bpm    Heart Rate (Exit) 84 bpm    Rating of Perceived Exertion (Exercise) 13    Symptoms none    Duration Continue with 30 min of aerobic exercise without signs/symptoms of physical distress.    Intensity THRR unchanged      Progression   Progression Continue to progress workloads to maintain intensity without signs/symptoms of physical distress.    Average METs 2.91      Resistance Training   Training Prescription Yes    Weight 3 lb    Reps 10-15      Interval Training   Interval Training No      Treadmill   MPH 2.9    Grade 0.5    Minutes 15    METs 3.42      NuStep   Level 4    SPM 80    Minutes 15    METs 2.4      Home Exercise Plan   Plans to continue exercise at Home (comment)   walking, staff videos   Frequency Add 3 additional days to program exercise sessions.    Initial Home Exercises Provided 11/28/20           Nutrition:  Target Goals: Understanding of nutrition guidelines, daily intake of sodium 1500mg , cholesterol 200mg ,  calories 30% from fat and 7% or  less from saturated fats, daily to have 5 or more servings of fruits and vegetables.  Education: All About Nutrition: -Group instruction provided by verbal, written material, interactive activities, discussions, models, and posters to present general guidelines for heart healthy nutrition including fat, fiber, MyPlate, the role of sodium in heart healthy nutrition, utilization of the nutrition label, and utilization of this knowledge for meal planning. Follow up email sent as well. Written material given at graduation.   Biometrics:  Pre Biometrics - 11/16/20 0940      Pre Biometrics   Height 5\' 10"  (1.778 m)    Weight 208 lb 11.2 oz (94.7 kg)    BMI (Calculated) 29.95    Single Leg Stand 30 seconds            Nutrition Therapy Plan and Nutrition Goals:  Nutrition Therapy & Goals - 11/28/20 0846      Nutrition Therapy   Diet Heart healthy, low Na    Drug/Food Interactions Statins/Certain Fruits    Protein (specify units) 75g    Fiber 30 grams    Whole Grain Foods 3 servings    Saturated Fats 12 max. grams    Fruits and Vegetables 8 servings/day    Sodium 1.5 grams      Personal Nutrition Goals   Nutrition Goal ST: calculate the amount of salt eaten day to day, having snack with afternoon coffee with protein, fat, and fiber - fruit and nuts, 1/2 sandwich, etc.  LT: limit salt intake to < 1.5g/day    Comments Richard [name] is a 70 yo male admitted to rehab with aortic stenosis; other conditions include HTN and HLD. Breakfast: 7am Egg - with some butter with english muffin (sourdough) or whole wheat bread or white flour tortilla or oatmeal or great grains cereal with 2% milk dried cherries or pecans. usually the egg option. Coffee - splash of 2% milk and water. Lunch 1130: variety - leftovers from night before or cold cut sandwich (whole wheat bread most of the time - [place] or smoked ham) or soup - canned (heart healthy kind). Diet pepsi. Snack: afternoon  coffee. Snack: banana or small piece of chocolate or sliced peaches 4pm due to feeling dizzy and irritable. Dinner 6-630pm: variety - pasta on sundays (1/3 pasta, protein, non-starchy vegetables), mondays leftover pasta, salads (3 servings of vegetables), potroast, chicken, salmon, potstickers, risotto full of vegetables, hamburger with oven roasted fries, out to dinner 1x/week on fridays. Diet coke with dinner. He reports eating many different kinds of vegetables like beets, greens, brussells sprouts and only disliking peas. Slow cooker, sautee, roasting, baking, seldom fried. Uses olive oil or butter. He feels that butter and salt are his weeknesses. Uses salt while cooking - pinch here and there - he does not feel he has overly salted food. Drinks during the day: water. He rpeorts eating a variety of nuts and beans as well. Discussed heart healthy eating.      Intervention Plan   Intervention Prescribe, educate and counsel regarding individualized specific dietary modifications aiming towards targeted core components such as weight, hypertension, lipid management, diabetes, heart failure and other comorbidities.;Nutrition handout(s) given to patient.    Expected Outcomes Short Term Goal: Understand basic principles of dietary content, such as calories, fat, sodium, cholesterol and nutrients.;Short Term Goal: A plan has been developed with personal nutrition goals set during dietitian appointment.;Long Term Goal: Adherence to prescribed nutrition plan.           Nutrition Assessments:  MEDIFICTS Score Key:  ?  70 Need to make dietary changes   40-70 Heart Healthy Diet  ? 40 Therapeutic Level Cholesterol Diet  Flowsheet Row Cardiac Rehab from 11/16/2020 in [facility] Cardiac and Pulmonary Rehab  Picture Your Plate Total Score on Admission 68     Picture Your Plate Scores:  <31 Unhealthy dietary pattern with much room for improvement.  41-50 Dietary pattern unlikely to meet recommendations for  good health and room for improvement.  51-60 More healthful dietary pattern, with some room for improvement.   >60 Healthy dietary pattern, although there may be some specific behaviors that could be improved.    Nutrition Goals Re-Evaluation:   Nutrition Goals Discharge (Final Nutrition Goals Re-Evaluation):   Psychosocial: Target Goals: Acknowledge presence or absence of significant depression and/or stress, maximize coping skills, provide positive support system. Participant is able to verbalize types and ability to use techniques and skills needed for reducing stress and depression.   Education: Stress, Anxiety, and Depression - Group verbal and visual presentation to define topics covered.  Reviews how body is impacted by stress, anxiety, and depression.  Also discusses healthy ways to reduce stress and to treat/manage anxiety and depression.  Written material given at graduation. Flowsheet Row Cardiac Rehab from 11/30/2020 in [facility] Cardiac and Pulmonary Rehab  Date 11/30/20  Educator [facility]  Instruction Review Code 1- [employer] Understanding      Education: Sleep Hygiene -Provides group verbal and written instruction about how sleep can affect your health.  Define sleep hygiene, discuss sleep cycles and impact of sleep habits. Review good sleep hygiene tips.    Initial Review & Psychosocial Screening:  Initial Psych Review & Screening - 11/13/20 1340      Initial Review   Current issues with Current Stress Concerns    Source of Stress Concerns Unable to participate in former interests or hobbies;Unable to perform yard/household activities      Family Dynamics   Good Support System? Yes   wife     Barriers   Psychosocial barriers to participate in program There are no identifiable barriers or psychosocial needs.;The patient should benefit from training in stress management and relaxation.      Screening Interventions   Interventions Encouraged to exercise;Provide feedback  about the scores to participant;To provide support and resources with identified psychosocial needs    Expected Outcomes Short Term goal: Utilizing psychosocial counselor, staff and physician to assist with identification of specific Stressors or current issues interfering with healing process. Setting desired goal for each stressor or current issue identified.;Long Term Goal: Stressors or current issues are controlled or eliminated.;Short Term goal: Identification and review with participant of any Quality of Life or Depression concerns found by scoring the questionnaire.;Long Term goal: The participant improves quality of Life and PHQ9 Scores as seen by post scores and/or verbalization of changes           Quality of Life Scores:   Quality of Life - 11/16/20 0933      Quality of Life   Select Quality of Life      Quality of Life Scores   Health/Function Pre 25.33 %    Socioeconomic Pre 24.99 %    Psych/Spiritual Pre 25.36 %    Family Pre 29.5 %    GLOBAL Pre 25.79 %          Scores of 19 and below usually indicate a poorer quality of life in these areas.  A difference of  2-3 points is a clinically meaningful difference.  A  difference of 2-3 points in the total score of the Quality of Life Index has been associated with significant improvement in overall quality of life, self-image, physical symptoms, and general health in studies assessing change in quality of life.  PHQ-9: Recent Review Flowsheet Data    Depression screen [facility] 2/9 11/16/2020   Decreased Interest 0   Down, Depressed, Hopeless 0   PHQ - 2 Score 0   Altered sleeping 0   Tired, decreased energy 1   Change in appetite 0   Feeling bad or failure about yourself  0   Trouble concentrating 0   Moving slowly or fidgety/restless 0   Suicidal thoughts 0   PHQ-9 Score 1   Difficult doing work/chores Not difficult at all     Interpretation of Total Score  Total Score Depression Severity:  1-4 = Minimal depression, 5-9  = Mild depression, 10-14 = Moderate depression, 15-19 = Moderately severe depression, 20-27 = Severe depression   Psychosocial Evaluation and Intervention:  Psychosocial Evaluation - 11/13/20 1344      Psychosocial Evaluation & Interventions   Interventions Encouraged to exercise with the program and follow exercise prescription    Comments Richard [name] main issue post TAVR has been figuring out his blood pressure/dizziness issues. They have adjusted medications which has helped, but he still has days where he feels dizzy. His MD is aware and they are hoping with time it will adjust itself.  He states this has caused stress since he can't do different activities around the house (ex: get on a ladder and fix things). He wasn't on a lot of medication prior to his TAVR and he is trying to find a new "normal" since he didn't realize for a while that his heart was having issues before surgery. He has also noticed an increase in his tinitis which is very frustrating for him.His sleep pattern has continued to stay stable throughout recovery which he is thankful for.    Expected Outcomes Short: attend cardiac rehab for education and exercise. Long; develop positive self care habits.    Continue Psychosocial Services  Follow up required by staff           Psychosocial Re-Evaluation:  Psychosocial Re-Evaluation    Row Name 11/28/20 0808             Psychosocial Re-Evaluation   Current issues with None Identified       Comments Richard [name] is doing well in rehab.  He is feeling good mentally and sleeping well.  He denies any major stressors currently and is recovering well.       Expected Outcomes Short: Continue to exercise for mental boost Long: Continue to stay positive.       Interventions Encouraged to attend Cardiac Rehabilitation for the exercise       Continue Psychosocial Services  Follow up required by staff              Psychosocial Discharge (Final Psychosocial Re-Evaluation):   Psychosocial Re-Evaluation - 11/28/20 0808      Psychosocial Re-Evaluation   Current issues with None Identified    Comments Richard [name] is doing well in rehab.  He is feeling good mentally and sleeping well.  He denies any major stressors currently and is recovering well.    Expected Outcomes Short: Continue to exercise for mental boost Long: Continue to stay positive.    Interventions Encouraged to attend Cardiac Rehabilitation for the exercise    Continue Psychosocial Services  Follow up required  by staff           Vocational Rehabilitation: Provide vocational rehab assistance to qualifying candidates.   Vocational Rehab Evaluation & Intervention:  Vocational Rehab - 11/13/20 1340      Initial Vocational Rehab Evaluation & Intervention   Assessment shows need for Vocational Rehabilitation No           Education: Education Goals: Education classes will be provided on a variety of topics geared toward better understanding of heart health and risk factor modification. Participant will state understanding/return demonstration of topics presented as noted by education test scores.  Learning Barriers/Preferences:  Learning Barriers/Preferences - 11/13/20 1340      Learning Barriers/Preferences   Learning Barriers None    Learning Preferences None           General Cardiac Education Topics:  AED/CPR: - Group verbal and written instruction with the use of models to demonstrate the basic use of the AED with the basic ABC's of resuscitation.   Anatomy and Cardiac Procedures: - Group verbal and visual presentation and models provide information about basic cardiac anatomy and function. Reviews the testing methods done to diagnose heart disease and the outcomes of the test results. Describes the treatment choices: Medical Management, Angioplasty, or Coronary Bypass Surgery for treating various heart conditions including Myocardial Infarction, Angina, Valve Disease, and Cardiac  Arrhythmias.  Written material given at graduation.   Medication Safety: - Group verbal and visual instruction to review commonly prescribed medications for heart and lung disease. Reviews the medication, class of the drug, and side effects. Includes the steps to properly store meds and maintain the prescription regimen.  Written material given at graduation.   Intimacy: - Group verbal instruction through game format to discuss how heart and lung disease can affect sexual intimacy. Written material given at graduation..   Know Your Numbers and Heart Failure: - Group verbal and visual instruction to discuss disease risk factors for cardiac and pulmonary disease and treatment options.  Reviews associated critical values for Overweight/Obesity, Hypertension, Cholesterol, and Diabetes.  Discusses basics of heart failure: signs/symptoms and treatments.  Introduces Heart Failure Zone chart for action plan for heart failure.  Written material given at graduation.   Infection Prevention: - Provides verbal and written material to individual with discussion of infection control including proper hand washing and proper equipment cleaning during exercise session. Flowsheet Row Cardiac Rehab from 11/30/2020 in [facility] Cardiac and Pulmonary Rehab  Education need identified 11/16/20  Date 11/16/20  Educator KL  Instruction Review Code 1- Verbalizes Understanding      Falls Prevention: - Provides verbal and written material to individual with discussion of falls prevention and safety. Flowsheet Row Cardiac Rehab from 11/30/2020 in [facility] Cardiac and Pulmonary Rehab  Education need identified 11/16/20  Date 11/16/20  Educator KL  Instruction Review Code 1- Verbalizes Understanding      Other: -Provides group and verbal instruction on various topics (see comments)   Knowledge Questionnaire Score:  Knowledge Questionnaire Score - 11/16/20 0933      Knowledge Questionnaire Score   Pre Score 26/26            Core Components/Risk Factors/Patient Goals at Admission:  Personal Goals and Risk Factors at Admission - 11/16/20 0951      Core Components/Risk Factors/Patient Goals on Admission    Weight Management Yes;Weight Loss    Intervention Weight Management: Develop a combined nutrition and exercise program designed to reach desired caloric intake, while maintaining appropriate intake of  nutrient and fiber, sodium and fats, and appropriate energy expenditure required for the weight goal.;Weight Management: Provide education and appropriate resources to help participant work on and attain dietary goals.;Weight Management/Obesity: Establish reasonable short term and long term weight goals.    Admit Weight 208 lb (94.3 kg)    Goal Weight: Short Term 203 lb (92.1 kg)    Goal Weight: Long Term 198 lb (89.8 kg)    Expected Outcomes Short Term: Continue to assess and modify interventions until short term weight is achieved;Long Term: Adherence to nutrition and physical activity/exercise program aimed toward attainment of established weight goal;Weight Loss: Understanding of general recommendations for a balanced deficit meal plan, which promotes 1-2 lb weight loss per week and includes a negative energy balance of [phone] kcal/d;Understanding recommendations for meals to include 15-35% energy as protein, 25-35% energy from fat, 35-60% energy from carbohydrates, less than 200mg  of dietary cholesterol, 20-35 gm of total fiber daily;Understanding of distribution of calorie intake throughout the day with the consumption of 4-5 meals/snacks    Hypertension Yes    Intervention Provide education on lifestyle modifcations including regular physical activity/exercise, weight management, moderate sodium restriction and increased consumption of fresh fruit, vegetables, and low fat dairy, alcohol moderation, and smoking cessation.;Monitor prescription use compliance.    Expected Outcomes Short Term: Continued  assessment and intervention until BP is < 140/64mm HG in hypertensive participants. < 130/27mm HG in hypertensive participants with diabetes, heart failure or chronic kidney disease.;Long Term: Maintenance of blood pressure at goal levels.    Lipids Yes    Intervention Provide education and support for participant on nutrition & aerobic/resistive exercise along with prescribed medications to achieve LDL 70mg , HDL >40mg .    Expected Outcomes Short Term: Participant states understanding of desired cholesterol values and is compliant with medications prescribed. Participant is following exercise prescription and nutrition guidelines.;Long Term: Cholesterol controlled with medications as prescribed, with individualized exercise RX and with personalized nutrition plan. Value goals: LDL < 70mg , HDL > 40 mg.           Education:Diabetes - Individual verbal and written instruction to review signs/symptoms of diabetes, desired ranges of glucose level fasting, after meals and with exercise. Acknowledge that pre and post exercise glucose checks will be done for 3 sessions at entry of program.   Core Components/Risk Factors/Patient Goals Review:   Goals and Risk Factor Review    Row Name 11/28/20 0807             Core Components/Risk Factors/Patient Goals Review   Personal Goals Review Weight Management/Obesity;Hypertension;Lipids       Review Wane is doing well in rehab.  His weight is holding steady. His blood pressures are doing well and he is checking them at home.       Expected Outcomes Short: Continue to work on weight loss Long: Continue to monitor risk factors              Core Components/Risk Factors/Patient Goals at Discharge (Final Review):   Goals and Risk Factor Review - 11/28/20 0807      Core Components/Risk Factors/Patient Goals Review   Personal Goals Review Weight Management/Obesity;Hypertension;Lipids    Review Richard [name] is doing well in rehab.  His weight is holding  steady. His blood pressures are doing well and he is checking them at home.    Expected Outcomes Short: Continue to work on weight loss Long: Continue to monitor risk factors           ITP  Comments:  ITP Comments    Row Name 11/13/20 1343 11/16/20 0914 11/21/20 0752 12/13/20 1012     ITP Comments Initial telephone orientation completed. Diagnosis can be found in [facility] 12/7. EP orientation scheduled for 3/24 at 8am. Completed and gym orientation. Initial ITP created and sent for review to Dr. Bethann Punches, Medical Director. First full day of exercise!  Patient was oriented to gym and equipment including functions, settings, policies, and procedures.  Patient's individual exercise prescription and treatment plan were reviewed.  All starting workloads were established based on the results of the 6 minute walk test done at initial orientation visit.  The plan for exercise progression was also introduced and progression will be customized based on patient's performance and goals. 30 Day review completed. Medical Director ITP review done, changes made as directed, and signed approval by Medical Director.           Comments:

## 2020-12-14 ENCOUNTER — Other Ambulatory Visit: Payer: Self-pay

## 2020-12-14 DIAGNOSIS — Z952 Presence of prosthetic heart valve: Secondary | ICD-10-CM | POA: Diagnosis not present

## 2020-12-14 NOTE — Progress Notes (Signed)
Daily Session Note  Patient Details  Name: Richard [name] MRN: [id number] Date of Birth: 07/05/1951 Referring Provider:   Flowsheet Row Cardiac Rehab from 11/16/2020 in [facility] Cardiac and Pulmonary Rehab  Referring Provider Neoma Laming MD      Encounter Date: 12/14/2020  Check In:  Session Check In - 12/14/20 0750      Check-In   Supervising physician immediately available to respond to emergencies See telemetry face sheet for immediately available ER MD    Location [facility]-Cardiac & Pulmonary Rehab    Staff Present Birdie Sons, MPA, Mauricia Area, BS, ACSM CEP, Exercise Physiologist;Amanda Oletta Darter, BA, ACSM CEP, Exercise Physiologist    Virtual Visit No    Medication changes reported     No    Fall or balance concerns reported    No    Warm-up and Cool-down Performed on first and last piece of equipment    Resistance Training Performed Yes    VAD Patient? No    PAD/SET Patient? No      Pain Assessment   Currently in Pain? No/denies              Social History   Tobacco Use  Smoking Status Never Smoker  Smokeless Tobacco Former Systems developer  . Types: Chew, Snuff    Goals Met:  Independence with exercise equipment Exercise tolerated well No report of cardiac concerns or symptoms Strength training completed today  Goals Unmet:  Not Applicable  Comments: Pt able to follow exercise prescription today without complaint.  Will continue to monitor for progression.    Dr. Emily Filbert is Medical Director for [place] and [facility].

## 2020-12-19 ENCOUNTER — Other Ambulatory Visit: Payer: Self-pay

## 2020-12-19 DIAGNOSIS — Z952 Presence of prosthetic heart valve: Secondary | ICD-10-CM

## 2020-12-19 NOTE — Progress Notes (Signed)
Daily Session Note  Patient Details  Name: Richard [name] MRN: [id number] Date of Birth: [birth year] Referring Provider:   Flowsheet Row Cardiac Rehab from 11/16/2020 in [facility] Cardiac and Pulmonary Rehab  Referring Provider Neoma Laming MD      Encounter Date: 12/19/2020  Check In:  Session Check In - 12/19/20 0758      Check-In   Supervising physician immediately available to respond to emergencies See telemetry face sheet for immediately available ER MD    Location [facility]-Cardiac & Pulmonary Rehab    Staff Present Birdie Sons, MPA, Elveria Rising, BA, ACSM CEP, Exercise Physiologist;Kara Eliezer Bottom, MS Exercise Physiologist    Virtual Visit No    Medication changes reported     No    Fall or balance concerns reported    No    Warm-up and Cool-down Performed on first and last piece of equipment    Resistance Training Performed Yes    VAD Patient? No    PAD/SET Patient? No      Pain Assessment   Currently in Pain? No/denies              Social History   Tobacco Use  Smoking Status Never Smoker  Smokeless Tobacco Former Systems developer  . Types: Chew, Snuff    Goals Met:  Independence with exercise equipment Exercise tolerated well No report of cardiac concerns or symptoms Strength training completed today  Goals Unmet:  Not Applicable  Comments: Pt able to follow exercise prescription today without complaint.  Will continue to monitor for progression.    Dr. Emily Filbert is Medical Director for [place] and [facility].

## 2020-12-21 ENCOUNTER — Other Ambulatory Visit: Payer: Self-pay

## 2020-12-21 DIAGNOSIS — Z952 Presence of prosthetic heart valve: Secondary | ICD-10-CM | POA: Diagnosis not present

## 2020-12-21 NOTE — Progress Notes (Signed)
Daily Session Note  Patient Details  Name: Richard [name] MRN: [id number] Date of Birth: 03-27-1951 Referring Provider:   Flowsheet Row Cardiac Rehab from 11/16/2020 in [facility] Cardiac and Pulmonary Rehab  Referring Provider Neoma Laming MD      Encounter Date: 12/21/2020  Check In:  Session Check In - 12/21/20 0800      Check-In   Supervising physician immediately available to respond to emergencies See telemetry face sheet for immediately available ER MD    Location [facility]-Cardiac & Pulmonary Rehab    Staff Present Birdie Sons, MPA, RN;Melissa Caiola RDN, Rowe Pavy, BA, ACSM CEP, Exercise Physiologist    Virtual Visit No    Medication changes reported     No    Fall or balance concerns reported    No    Warm-up and Cool-down Performed on first and last piece of equipment    Resistance Training Performed Yes    VAD Patient? No    PAD/SET Patient? No      Pain Assessment   Currently in Pain? No/denies              Social History   Tobacco Use  Smoking Status Never Smoker  Smokeless Tobacco Former Systems developer  . Types: Chew, Snuff    Goals Met:  Independence with exercise equipment Exercise tolerated well No report of cardiac concerns or symptoms Strength training completed today  Goals Unmet:  Not Applicable  Comments: Pt able to follow exercise prescription today without complaint.  Will continue to monitor for progression.    Dr. Emily Filbert is Medical Director for [place] and [facility].

## 2020-12-26 ENCOUNTER — Other Ambulatory Visit: Payer: Self-pay

## 2020-12-26 ENCOUNTER — Encounter: Payer: Medicare Other | Attending: Cardiovascular Disease

## 2020-12-26 DIAGNOSIS — Z952 Presence of prosthetic heart valve: Secondary | ICD-10-CM | POA: Insufficient documentation

## 2020-12-26 NOTE — Progress Notes (Signed)
Daily Session Note  Patient Details  Name: Richard [name] MRN: [id number] Date of Birth: 06/07/1951 Referring Provider:   Flowsheet Row Cardiac Rehab from 11/16/2020 in [facility] Cardiac and Pulmonary Rehab  Referring Provider Neoma Laming MD      Encounter Date: 12/26/2020  Check In:  Session Check In - 12/26/20 0752      Check-In   Supervising physician immediately available to respond to emergencies See telemetry face sheet for immediately available ER MD    Location [facility]-Cardiac & Pulmonary Rehab    Staff Present Birdie Sons, MPA, Elveria Rising, BA, ACSM CEP, Exercise Physiologist;Kara Eliezer Bottom, MS Exercise Physiologist    Virtual Visit No    Medication changes reported     No    Fall or balance concerns reported    No    Warm-up and Cool-down Performed on first and last piece of equipment    Resistance Training Performed Yes    VAD Patient? No    PAD/SET Patient? No      Pain Assessment   Currently in Pain? No/denies              Social History   Tobacco Use  Smoking Status Never Smoker  Smokeless Tobacco Former Systems developer  . Types: Chew, Snuff    Goals Met:  Independence with exercise equipment Exercise tolerated well No report of cardiac concerns or symptoms Strength training completed today  Goals Unmet:  Not Applicable  Comments: Pt able to follow exercise prescription today without complaint.  Will continue to monitor for progression.    Dr. Emily Filbert is Medical Director for [place] and [facility].

## 2020-12-28 ENCOUNTER — Other Ambulatory Visit: Payer: Self-pay

## 2020-12-28 DIAGNOSIS — Z952 Presence of prosthetic heart valve: Secondary | ICD-10-CM | POA: Diagnosis not present

## 2020-12-28 NOTE — Progress Notes (Signed)
Daily Session Note  Patient Details  Name: HELIOS KOHLMANN MRN: [id number] Date of Birth: September 22, 1950 Referring Provider:   Flowsheet Row Cardiac Rehab from 11/16/2020 in [facility] Cardiac and Pulmonary Rehab  Referring Provider Neoma Laming MD      Encounter Date: 12/28/2020  Check In:  Session Check In - 12/28/20 0746      Check-In   Supervising physician immediately available to respond to emergencies See telemetry face sheet for immediately available ER MD    Location [facility]-Cardiac & Pulmonary Rehab    Staff Present Birdie Sons, MPA, RN;Jessica Luan Pulling, MA, RCEP, CCRP, Rosalio Macadamia, BS, ACSM CEP, Exercise Physiologist    Virtual Visit No    Medication changes reported     No    Fall or balance concerns reported    No    Warm-up and Cool-down Performed on first and last piece of equipment    Resistance Training Performed Yes    VAD Patient? No    PAD/SET Patient? No      Pain Assessment   Currently in Pain? No/denies              Social History   Tobacco Use  Smoking Status Never Smoker  Smokeless Tobacco Former Systems developer  . Types: Chew, Snuff    Goals Met:  Independence with exercise equipment Exercise tolerated well No report of cardiac concerns or symptoms Strength training completed today  Goals Unmet:  Not Applicable  Comments: Pt able to follow exercise prescription today without complaint.  Will continue to monitor for progression.    Dr. Emily Filbert is Medical Director for [place] and [facility].

## 2021-01-02 ENCOUNTER — Other Ambulatory Visit: Payer: Self-pay

## 2021-01-02 DIAGNOSIS — Z952 Presence of prosthetic heart valve: Secondary | ICD-10-CM

## 2021-01-02 NOTE — Progress Notes (Signed)
Daily Session Note  Patient Details  Name: Richard [name] MRN: [id number] Date of Birth: June 15, 1951 Referring Provider:   Flowsheet Row Cardiac Rehab from 11/16/2020 in [facility] Cardiac and Pulmonary Rehab  Referring Provider Neoma Laming MD      Encounter Date: 01/02/2021  Check In:  Session Check In - 01/02/21 0759      Check-In   Supervising physician immediately available to respond to emergencies See telemetry face sheet for immediately available ER MD    Location [facility]-Cardiac & Pulmonary Rehab    Staff Present Birdie Sons, MPA, Elveria Rising, BA, ACSM CEP, Exercise Physiologist;Kara Eliezer Bottom, MS Exercise Physiologist    Virtual Visit No    Medication changes reported     No    Fall or balance concerns reported    No    Warm-up and Cool-down Performed on first and last piece of equipment    Resistance Training Performed Yes    VAD Patient? No    PAD/SET Patient? No      Pain Assessment   Currently in Pain? No/denies              Social History   Tobacco Use  Smoking Status Never Smoker  Smokeless Tobacco Former Systems developer  . Types: Chew, Snuff    Goals Met:  Independence with exercise equipment Exercise tolerated well No report of cardiac concerns or symptoms Strength training completed today  Goals Unmet:  Not Applicable  Comments: Pt able to follow exercise prescription today without complaint.  Will continue to monitor for progression.    Dr. Emily Filbert is Medical Director for [place] and [facility].

## 2021-01-04 ENCOUNTER — Other Ambulatory Visit: Payer: Self-pay

## 2021-01-04 DIAGNOSIS — Z952 Presence of prosthetic heart valve: Secondary | ICD-10-CM

## 2021-01-04 NOTE — Progress Notes (Signed)
Daily Session Note  Patient Details  Name: Richard [name] MRN: [id number] Date of Birth: 02/23/1951 Referring Provider:   Flowsheet Row Cardiac Rehab from 11/16/2020 in [facility] Cardiac and Pulmonary Rehab  Referring Provider Neoma Laming MD      Encounter Date: 01/04/2021  Check In:  Session Check In - 01/04/21 0747      Check-In   Supervising physician immediately available to respond to emergencies See telemetry face sheet for immediately available ER MD    Location [facility]-Cardiac & Pulmonary Rehab    Staff Present Birdie Sons, MPA, Mauricia Area, BS, ACSM CEP, Exercise Physiologist;Amanda Oletta Darter, BA, ACSM CEP, Exercise Physiologist    Virtual Visit No    Medication changes reported     Yes    Comments added amoxicillin temporarily for dental procedure    Fall or balance concerns reported    No    Warm-up and Cool-down Performed on first and last piece of equipment    Resistance Training Performed Yes    VAD Patient? No    PAD/SET Patient? No      Pain Assessment   Currently in Pain? No/denies              Social History   Tobacco Use  Smoking Status Never Smoker  Smokeless Tobacco Former Systems developer  . Types: Chew, Snuff    Goals Met:  Independence with exercise equipment Exercise tolerated well No report of cardiac concerns or symptoms Strength training completed today  Goals Unmet:  Not Applicable  Comments: Pt able to follow exercise prescription today without complaint.  Will continue to monitor for progression.    Dr. Emily Filbert is Medical Director for [place] and [facility].

## 2021-01-09 ENCOUNTER — Other Ambulatory Visit: Payer: Self-pay

## 2021-01-09 DIAGNOSIS — Z952 Presence of prosthetic heart valve: Secondary | ICD-10-CM | POA: Diagnosis not present

## 2021-01-09 NOTE — Progress Notes (Signed)
Daily Session Note  Patient Details  Name: ANAND TEJADA MRN: [id number] Date of Birth: 11/29/1950 Referring Provider:   Flowsheet Row Cardiac Rehab from 11/16/2020 in [facility] Cardiac and Pulmonary Rehab  Referring Provider Neoma Laming MD      Encounter Date: 01/09/2021  Check In:  Session Check In - 01/09/21 0755      Check-In   Supervising physician immediately available to respond to emergencies See telemetry face sheet for immediately available ER MD    Location [facility]-Cardiac & Pulmonary Rehab    Staff Present Birdie Sons, MPA, Elveria Rising, BA, ACSM CEP, Exercise Physiologist;Joseph Tessie Fass RCP,RRT,BSRT    Virtual Visit No    Medication changes reported     No    Fall or balance concerns reported    No    Warm-up and Cool-down Performed on first and last piece of equipment    Resistance Training Performed Yes    VAD Patient? No    PAD/SET Patient? No      Pain Assessment   Currently in Pain? No/denies              Social History   Tobacco Use  Smoking Status Never Smoker  Smokeless Tobacco Former Systems developer  . Types: Chew, Snuff    Goals Met:  Independence with exercise equipment Exercise tolerated well No report of cardiac concerns or symptoms Strength training completed today  Goals Unmet:  Not Applicable  Comments: Pt able to follow exercise prescription today without complaint.  Will continue to monitor for progression.    Dr. Emily Filbert is Medical Director for [place] and [facility].

## 2021-01-10 ENCOUNTER — Encounter: Payer: Self-pay | Admitting: *Deleted

## 2021-01-10 DIAGNOSIS — Z952 Presence of prosthetic heart valve: Secondary | ICD-10-CM

## 2021-01-10 NOTE — Progress Notes (Signed)
Cardiac Individual Treatment Plan  Patient Details  Name: Richard [name] MRN: [id number] Date of Birth: [birth year] Referring Provider:   Flowsheet Row Cardiac Rehab from 11/16/2020 in [facility] Cardiac and Pulmonary Rehab  Referring Provider Neoma Laming MD      Initial Encounter Date:  Flowsheet Row Cardiac Rehab from 11/16/2020 in [facility] Cardiac and Pulmonary Rehab  Date 11/16/20      Visit Diagnosis: S/P TAVR (transcatheter aortic valve replacement)  Patient's Home Medications on Admission:  Current Outpatient Medications:  .  amLODipine (NORVASC) 10 MG tablet, Take 1 tablet by mouth daily., Disp: , Rfl:  .  amLODipine (NORVASC) 5 MG tablet, Take 1 tablet (5 mg total) by mouth daily. (Patient not taking: Reported on 11/13/2020), Disp: 30 tablet, Rfl: 2 .  amoxicillin (AMOXIL) 500 MG capsule, Take by mouth., Disp: , Rfl:  .  aspirin 81 MG chewable tablet, Chew by mouth., Disp: , Rfl:  .  Cholecalciferol (VITAMIN D3) 50 MCG (2000 UT) TABS, Take 2,000 Units by mouth 3 (three) times a week., Disp: , Rfl:  .  Coenzyme Q10 (CO Q 10) 100 MG CAPS, Take 100 mg by mouth daily., Disp: , Rfl:  .  losartan (COZAAR) 50 MG tablet, Take 1 tablet by mouth daily., Disp: , Rfl:  .  losartan-hydrochlorothiazide (HYZAAR) 50-12.5 MG tablet, Take 1 tablet by mouth daily. (Patient not taking: Reported on 11/13/2020), Disp: , Rfl:  .  rosuvastatin (CRESTOR) 20 MG tablet, Take 20 mg by mouth at bedtime., Disp: , Rfl:  No current facility-administered medications for this visit.  Facility-Administered Medications Ordered in Other Visits:  .  sodium chloride flush (NS) 0.9 % injection 3 mL, 3 mL, Intravenous, Q12H, Dionisio David, MD  Past Medical History: Past Medical History:  Diagnosis Date  . Aortic stenosis   . Hyperlipidemia   . Hypertension   . Mitral valve regurgitation     Tobacco Use: Social History   Tobacco Use  Smoking Status Never Smoker  Smokeless Tobacco Former Systems developer  . Types: Chew,  Snuff    Labs: Recent Review Flowsheet Data    Labs for ITP Cardiac and Pulmonary Rehab Latest Ref Rng & Units 05/14/2020   Cholestrol 0 - 200 mg/dL 329(H)   LDLCALC 0 - 99 mg/dL 251(H)   HDL >40 mg/dL 41   Trlycerides <150 mg/dL 186(H)       Exercise Target Goals: Exercise Program Goal: Individual exercise prescription set using results from initial 6 min walk test and THRR while considering  patient's activity barriers and safety.   Exercise Prescription Goal: Initial exercise prescription builds to 30-45 minutes a day of aerobic activity, 2-3 days per week.  Home exercise guidelines will be given to patient during program as part of exercise prescription that the participant will acknowledge.   Education: Aerobic Exercise: - Group verbal and visual presentation on the components of exercise prescription. Introduces F.I.T.T principle from ACSM for exercise prescriptions.  Reviews F.I.T.T. principles of aerobic exercise including progression. Written material given at graduation.   Education: Resistance Exercise: - Group verbal and visual presentation on the components of exercise prescription. Introduces F.I.T.T principle from ACSM for exercise prescriptions  Reviews F.I.T.T. principles of resistance exercise including progression. Written material given at graduation.    Education: Exercise & Equipment Safety: - Individual verbal instruction and demonstration of equipment use and safety with use of the equipment. Flowsheet Row Cardiac Rehab from 12/28/2020 in [facility] Cardiac and Pulmonary Rehab  Education need identified 11/16/20  Date 11/16/20  Educator [place]  Instruction Review Code 1- Verbalizes Understanding      Education: Exercise Physiology & General Exercise Guidelines: - Group verbal and written instruction with models to review the exercise physiology of the cardiovascular system and associated critical values. Provides general exercise guidelines with specific guidelines  to those with heart or lung disease.    Education: Flexibility, Balance, Mind/Body Relaxation: - Group verbal and visual presentation with interactive activity on the components of exercise prescription. Introduces F.I.T.T principle from ACSM for exercise prescriptions. Reviews F.I.T.T. principles of flexibility and balance exercise training including progression. Also discusses the mind body connection.  Reviews various relaxation techniques to help reduce and manage stress (i.e. Deep breathing, progressive muscle relaxation, and visualization). Balance handout provided to take home. Written material given at graduation. Flowsheet Row Cardiac Rehab from 12/28/2020 in [facility] Cardiac and Pulmonary Rehab  Date 12/28/20  Educator [place]  Instruction Review Code 1- Verbalizes Understanding      Activity Barriers & Risk Stratification:  Activity Barriers & Cardiac Risk Stratification - 11/16/20 0941      Activity Barriers & Cardiac Risk Stratification   Activity Barriers Deconditioning;Muscular Weakness    Cardiac Risk Stratification Moderate           6 Minute Walk:  6 Minute Walk    Row Name 11/16/20 0938         6 Minute Walk   Phase Initial     Distance 1470 feet     Walk Time 6 minutes     # of Rest Breaks 0     MPH 2.78     METS 3.26     RPE 11     Perceived Dyspnea  0     VO2 Peak 11.41     Symptoms No     Resting HR 67 bpm     Resting BP 128/68     Resting Oxygen Saturation  98 %     Exercise Oxygen Saturation  during 6 min walk 98 %     Max Ex. HR 87 bpm     Max Ex. BP 152/68     2 Minute Post BP 134/68            Oxygen Initial Assessment:   Oxygen Re-Evaluation:   Oxygen Discharge (Final Oxygen Re-Evaluation):   Initial Exercise Prescription:  Initial Exercise Prescription - 11/16/20 0900      Date of Initial Exercise RX and Referring Provider   Date 11/16/20    Referring Provider Neoma Laming MD      Treadmill   MPH 2.7    Grade 0.5    Minutes 15     METs 3.25      Recumbant Bike   Level 3    RPM 60    Watts 25    Minutes 15    METs 3.2      NuStep   Level 3    SPM 80    Minutes 15    METs 3.2      T5 Nustep   Level 2    SPM 80    Minutes 15    METs 3.2      Prescription Details   Frequency (times per week) 2    Duration Progress to 30 minutes of continuous aerobic without signs/symptoms of physical distress      Intensity   THRR 40-80% of Max Heartrate 101-135    Ratings of Perceived Exertion 11-13    Perceived  Dyspnea 0-4      Progression   Progression Continue to progress workloads to maintain intensity without signs/symptoms of physical distress.      Resistance Training   Training Prescription Yes    Weight 3 lb    Reps 10-15           Perform Capillary Blood Glucose checks as needed.  Exercise Prescription Changes:  Exercise Prescription Changes    Row Name 11/16/20 0900 11/21/20 1600 11/28/20 0800 12/05/20 1500 12/21/20 1400     Response to Exercise   Blood Pressure (Admit) 128/68 142/70 -- 138/66 124/68   Blood Pressure (Exercise) 152/68 142/64 -- 144/68 164/74   Blood Pressure (Exit) 134/68 126/68 -- 124/68 124/66   Heart Rate (Admit) 67 bpm 80 bpm -- 77 bpm 66 bpm   Heart Rate (Exercise) 87 bpm 102 bpm -- 105 bpm 102 bpm   Heart Rate (Exit) 73 bpm 85 bpm -- 84 bpm 72 bpm   Oxygen Saturation (Admit) 98 % -- -- -- --   Oxygen Saturation (Exercise) 98 % -- -- -- --   Oxygen Saturation (Exit) 98 % -- -- -- --   Rating of Perceived Exertion (Exercise) 11 13 -- 13 13   Perceived Dyspnea (Exercise) 0 -- -- -- --   Symptoms none none -- none none   Comments walk test results first full day of exercise -- -- --   Duration -- Progress to 30 minutes of  aerobic without signs/symptoms of physical distress -- Continue with 30 min of aerobic exercise without signs/symptoms of physical distress. Continue with 30 min of aerobic exercise without signs/symptoms of physical distress.   Intensity -- THRR  unchanged -- THRR unchanged THRR unchanged     Progression   Progression -- Continue to progress workloads to maintain intensity without signs/symptoms of physical distress. -- Continue to progress workloads to maintain intensity without signs/symptoms of physical distress. Continue to progress workloads to maintain intensity without signs/symptoms of physical distress.   Average METs -- 2.63 -- 2.91 3.46     Resistance Training   Training Prescription -- Yes -- Yes Yes   Weight -- 3 lb -- 3 lb 5 lb   Reps -- 10-15 -- 10-15 10-15     Interval Training   Interval Training -- No -- No No     Treadmill   MPH -- 2.7 -- 2.9 3.5   Grade -- 0.5 -- 0.5 0.5   Minutes -- 15 -- 15 15   METs -- 3.25 -- 3.42 3.92     Recumbant Bike   Level -- -- -- -- 7   Watts -- -- -- -- 48   Minutes -- -- -- -- 15   METs -- -- -- -- 6.75     NuStep   Level -- -- -- 4 --   SPM -- -- -- 80 --   Minutes -- -- -- 15 --   METs -- -- -- 2.4 --     T5 Nustep   Level -- 3 -- -- 6   Minutes -- 15 -- -- 15   METs -- 2 -- -- 2.9     Home Exercise Plan   Plans to continue exercise at -- -- Home (comment)  walking, staff videos Home (comment)  walking, staff videos Home (comment)  walking, staff videos   Frequency -- -- Add 3 additional days to program exercise sessions. Add 3 additional days to program exercise sessions. Add 3 additional days  to program exercise sessions.   Initial Home Exercises Provided -- -- 11/28/20 11/28/20 11/28/20   Row Name 01/03/21 1400             Response to Exercise   Blood Pressure (Admit) 134/66       Blood Pressure (Exercise) 182/68  recheck 154/68       Blood Pressure (Exit) 144/68       Heart Rate (Admit) 71 bpm       Heart Rate (Exercise) 96 bpm       Heart Rate (Exit) 93 bpm       Rating of Perceived Exertion (Exercise) 13       Symptoms none       Duration Continue with 30 min of aerobic exercise without signs/symptoms of physical distress.       Intensity THRR  unchanged               Progression   Progression Continue to progress workloads to maintain intensity without signs/symptoms of physical distress.       Average METs 5.78               Resistance Training   Training Prescription Yes       Weight 5 lb       Reps 10-15               Interval Training   Interval Training No               Treadmill   MPH 3.5       Grade 1       Minutes 15       METs 4.16               REL-XR   Level 9       Minutes 15       METs 7.4              Exercise Comments:  Exercise Comments    Row Name 11/21/20 4709           Exercise Comments First full day of exercise!  Patient was oriented to gym and equipment including functions, settings, policies, and procedures.  Patient's individual exercise prescription and treatment plan were reviewed.  All starting workloads were established based on the results of the 6 minute walk test done at initial orientation visit.  The plan for exercise progression was also introduced and progression will be customized based on patient's performance and goals.              Exercise Goals and Review:  Exercise Goals    Row Name 11/16/20 0950             Exercise Goals   Increase Physical Activity Yes       Intervention Provide advice, education, support and counseling about physical activity/exercise needs.;Develop an individualized exercise prescription for aerobic and resistive training based on initial evaluation findings, risk stratification, comorbidities and participant's personal goals.       Expected Outcomes Short Term: Attend rehab on a regular basis to increase amount of physical activity.;Long Term: Add in home exercise to make exercise part of routine and to increase amount of physical activity.;Long Term: Exercising regularly at least 3-5 days a week.       Increase Strength and Stamina Yes       Intervention Provide advice, education, support and counseling about physical  activity/exercise needs.;Develop an individualized exercise prescription for aerobic and  resistive training based on initial evaluation findings, risk stratification, comorbidities and participant's personal goals.       Expected Outcomes Short Term: Increase workloads from initial exercise prescription for resistance, speed, and METs.;Short Term: Perform resistance training exercises routinely during rehab and add in resistance training at home;Long Term: Improve cardiorespiratory fitness, muscular endurance and strength as measured by increased METs and functional capacity (6MWT)       Able to understand and use rate of perceived exertion (RPE) scale Yes       Intervention Provide education and explanation on how to use RPE scale       Expected Outcomes Short Term: Able to use RPE daily in rehab to express subjective intensity level;Long Term:  Able to use RPE to guide intensity level when exercising independently       Able to understand and use Dyspnea scale Yes       Intervention Provide education and explanation on how to use Dyspnea scale       Expected Outcomes Short Term: Able to use Dyspnea scale daily in rehab to express subjective sense of shortness of breath during exertion;Long Term: Able to use Dyspnea scale to guide intensity level when exercising independently       Knowledge and understanding of Target Heart Rate Range (THRR) Yes       Intervention Provide education and explanation of THRR including how the numbers were predicted and where they are located for reference       Expected Outcomes Short Term: Able to state/look up THRR;Short Term: Able to use daily as guideline for intensity in rehab;Long Term: Able to use THRR to govern intensity when exercising independently       Able to check pulse independently Yes       Intervention Provide education and demonstration on how to check pulse in carotid and radial arteries.;Review the importance of being able to check your own pulse for  safety during independent exercise       Expected Outcomes Short Term: Able to explain why pulse checking is important during independent exercise;Long Term: Able to check pulse independently and accurately       Understanding of Exercise Prescription Yes       Intervention Provide education, explanation, and written materials on patient's individual exercise prescription       Expected Outcomes Short Term: Able to explain program exercise prescription;Long Term: Able to explain home exercise prescription to exercise independently              Exercise Goals Re-Evaluation :  Exercise Goals Re-Evaluation    Row Name 11/21/20 0752 11/28/20 0812 12/05/20 1518 12/21/20 1344 01/02/21 0804     Exercise Goal Re-Evaluation   Exercise Goals Review Increase Physical Activity;Able to understand and use rate of perceived exertion (RPE) scale;Knowledge and understanding of Target Heart Rate Range (THRR);Understanding of Exercise Prescription;Increase Strength and Stamina;Able to understand and use Dyspnea scale;Able to check pulse independently Increase Physical Activity;Increase Strength and Stamina;Understanding of Exercise Prescription;Able to understand and use rate of perceived exertion (RPE) scale;Able to understand and use Dyspnea scale;Knowledge and understanding of Target Heart Rate Range (THRR);Able to check pulse independently Increase Physical Activity;Increase Strength and Stamina Increase Physical Activity;Increase Strength and Stamina;Understanding of Exercise Prescription Increase Physical Activity;Increase Strength and Stamina;Understanding of Exercise Prescription   Comments Reviewed RPE and dyspnea scales, THR and program prescription with pt today.  Pt voiced understanding and was given a copy of goals to take home. Reviewed home exercise with  pt today.  Pt plans to walk and use weights for exercise. We also talked about using the staff videos on bad weather days.  Reviewed THR, pulse, RPE,  sign and symptoms, pulse oximetery and when to call 911 or MD.  Also discussed weather considerations and indoor options.  Pt voiced understanding. Ronalee Belts is progressing well with exercise. He has increased levels on TM and seated machines.  Staff wil lmonitor progress. Ronalee Belts is doing well in rehab.  Ronalee Belts is doing level 7 on the bike and using 5 lb hand weights.  We will continue to monitor his progress. He walks at home on the days he is not here except for mondays and fridays - 2 miles with warm up and cool down lap as well as 6 laps of higher intensity walking (~40 minutes, RPE 13). just short of running. 8lb weights at home with the same days as aerobic exercise. He is also very active during the day.   Expected Outcomes Short: Use RPE daily to regulate intensity. Long: Follow program prescription in THR. Short: Start to add in exercise at home Long; Continue to follow program prescription Short: continue to attend consistently Long:  continue to build stamina Short: Continue to move up on workloads.  Long: Continue to build stamina Short: Continue to move up on workloads, continue to exercise at home  Long: Continue to build stamina   Row Name 01/03/21 1415             Exercise Goal Re-Evaluation   Exercise Goals Review Increase Physical Activity;Increase Strength and Stamina       Comments Dylin is progressing well and is up to level 9 on XR.  Staff will encourage trying 6 lb for strength work.       Expected Outcomes Short: move up to 6 lb for sterngth work Long:  increase overall MET level              Discharge Exercise Prescription (Final Exercise Prescription Changes):  Exercise Prescription Changes - 01/03/21 1400      Response to Exercise   Blood Pressure (Admit) 134/66    Blood Pressure (Exercise) 182/68   recheck 154/68   Blood Pressure (Exit) 144/68    Heart Rate (Admit) 71 bpm    Heart Rate (Exercise) 96 bpm    Heart Rate (Exit) 93 bpm    Rating of Perceived Exertion  (Exercise) 13    Symptoms none    Duration Continue with 30 min of aerobic exercise without signs/symptoms of physical distress.    Intensity THRR unchanged      Progression   Progression Continue to progress workloads to maintain intensity without signs/symptoms of physical distress.    Average METs 5.78      Resistance Training   Training Prescription Yes    Weight 5 lb    Reps 10-15      Interval Training   Interval Training No      Treadmill   MPH 3.5    Grade 1    Minutes 15    METs 4.16      REL-XR   Level 9    Minutes 15    METs 7.4           Nutrition:  Target Goals: Understanding of nutrition guidelines, daily intake of sodium <1550m, cholesterol <2068m calories 30% from fat and 7% or less from saturated fats, daily to have 5 or more servings of fruits and vegetables.  Education: All About Nutrition: -  Group instruction provided by verbal, written material, interactive activities, discussions, models, and posters to present general guidelines for heart healthy nutrition including fat, fiber, MyPlate, the role of sodium in heart healthy nutrition, utilization of the nutrition label, and utilization of this knowledge for meal planning. Follow up email sent as well. Written material given at graduation.   Biometrics:  Pre Biometrics - 11/16/20 0940      Pre Biometrics   Height _0  (1.778 m)    Weight 208 lb 11.2 oz (94.7 kg)    BMI (Calculated) 29.95    Single Leg Stand 30 seconds            Nutrition Therapy Plan and Nutrition Goals:  Nutrition Therapy & Goals - 11/28/20 0846      Nutrition Therapy   Diet Heart healthy, low Na    Drug/Food Interactions Statins/Certain Fruits    Protein (specify units) 75g    Fiber 30 grams    Whole Grain Foods 3 servings    Saturated Fats 12 max. grams    Fruits and Vegetables 8 servings/day    Sodium 1.5 grams      Personal Nutrition Goals   Nutrition Goal ST: calculate the amount of salt eaten day to  day, having snack with afternoon coffee with protein, fat, and fiber - fruit and nuts, 1/2 sandwich, etc.  LT: limit salt intake to < 1.5g/day    Comments Chick is a 70 yo male admitted to rehab with aortic stenosis; other conditions include HTN and HLD. Breakfast: 7am Egg - with some butter with english muffin (sourdough) or whole wheat bread or white flour tortilla or oatmeal or great grains cereal with 2% milk dried cherries or pecans. usually the egg option. Coffee - splash of 2% milk and water. Lunch 1130: variety - leftovers from night before or cold cut sandwich (whole wheat bread most of the time - [place] or smoked ham) or soup - canned (heart healthy kind). Diet pepsi. Snack: afternoon coffee. Snack: banana or small piece of chocolate or sliced peaches 4pm due to feeling dizzy and irritable. Dinner 6-630pm: variety - pasta on sundays (1/3 pasta, protein, non-starchy vegetables), mondays leftover pasta, salads (3 servings of vegetables), potroast, chicken, salmon, potstickers, risotto full of vegetables, hamburger with oven roasted fries, out to dinner 1x/week on fridays. Diet coke with dinner. He reports eating many different kinds of vegetables like beets, greens, brussells sprouts and only disliking peas. Slow cooker, sautee, roasting, baking, seldom fried. Uses olive oil or butter. He feels that butter and salt are his weeknesses. Uses salt while cooking - pinch here and there - he does not feel he has overly salted food. Drinks during the day: water. He rpeorts eating a variety of nuts and beans as well. Discussed heart healthy eating.      Intervention Plan   Intervention Prescribe, educate and counsel regarding individualized specific dietary modifications aiming towards targeted core components such as weight, hypertension, lipid management, diabetes, heart failure and other comorbidities.;Nutrition handout(s) given to patient.    Expected Outcomes Short Term Goal: Understand basic principles  of dietary content, such as calories, fat, sodium, cholesterol and nutrients.;Short Term Goal: A plan has been developed with personal nutrition goals set during dietitian appointment.;Long Term Goal: Adherence to prescribed nutrition plan.           Nutrition Assessments:  MEDIFICTS Score Key:  ?70 Need to make dietary changes   40-70 Heart Healthy Diet  ? 40 Therapeutic Level Cholesterol  Diet  Flowsheet Row Cardiac Rehab from 11/16/2020 in [facility] Cardiac and Pulmonary Rehab  Picture Your Plate Total Score on Admission 68     Picture Your Plate Scores:  <93 Unhealthy dietary pattern with much room for improvement.  41-50 Dietary pattern unlikely to meet recommendations for good health and room for improvement.  51-60 More healthful dietary pattern, with some room for improvement.   >60 Healthy dietary pattern, although there may be some specific behaviors that could be improved.    Nutrition Goals Re-Evaluation:  Nutrition Goals Re-Evaluation    [place] Name 01/02/21 0758             Goals   Nutrition Goal ST: continue working on reducing salt. LT: limit salt intake to < 1.5g/day       Comment He reports no changes other than limiting added salt with meals since we last spoke and he reports doing well and feels his diet is healthy and would not like to make any toher changes at this time.       Expected Outcome ST: continue working on reducing salt. LT: limit salt intake to < 1.5g/day              Nutrition Goals Discharge (Final Nutrition Goals Re-Evaluation):  Nutrition Goals Re-Evaluation - 01/02/21 0758      Goals   Nutrition Goal ST: continue working on reducing salt. LT: limit salt intake to < 1.5g/day    Comment He reports no changes other than limiting added salt with meals since we last spoke and he reports doing well and feels his diet is healthy and would not like to make any toher changes at this time.    Expected Outcome ST: continue working on reducing  salt. LT: limit salt intake to < 1.5g/day           Psychosocial: Target Goals: Acknowledge presence or absence of significant depression and/or stress, maximize coping skills, provide positive support system. Participant is able to verbalize types and ability to use techniques and skills needed for reducing stress and depression.   Education: Stress, Anxiety, and Depression - Group verbal and visual presentation to define topics covered.  Reviews how body is impacted by stress, anxiety, and depression.  Also discusses healthy ways to reduce stress and to treat/manage anxiety and depression.  Written material given at graduation. Flowsheet Row Cardiac Rehab from 12/28/2020 in [facility] Cardiac and Pulmonary Rehab  Date 11/30/20  Educator [facility]  Instruction Review Code 1- [employer] Understanding      Education: Sleep Hygiene -Provides group verbal and written instruction about how sleep can affect your health.  Define sleep hygiene, discuss sleep cycles and impact of sleep habits. Review good sleep hygiene tips.    Initial Review & Psychosocial Screening:  Initial Psych Review & Screening - 11/13/20 1340      Initial Review   Current issues with Current Stress Concerns    Source of Stress Concerns Unable to participate in former interests or hobbies;Unable to perform yard/household activities      [place]? Yes   wife     Barriers   Psychosocial barriers to participate in program There are no identifiable barriers or psychosocial needs.;The patient should benefit from training in stress management and relaxation.      Screening Interventions   Interventions Encouraged to exercise;Provide feedback about the scores to participant;To provide support and resources with identified psychosocial needs    Expected Outcomes Short Term goal:  Utilizing psychosocial counselor, staff and physician to assist with identification of specific Stressors or current issues  interfering with healing process. Setting desired goal for each stressor or current issue identified.;Long Term Goal: Stressors or current issues are controlled or eliminated.;Short Term goal: Identification and review with participant of any Quality of Life or Depression concerns found by scoring the questionnaire.;Long Term goal: The participant improves quality of Life and PHQ9 Scores as seen by post scores and/or verbalization of changes           Quality of Life Scores:   Quality of Life - 11/16/20 0933      Quality of Life   Select Quality of Life      Quality of Life Scores   Health/Function Pre 25.33 %    Socioeconomic Pre 24.99 %    Psych/Spiritual Pre 25.36 %    Family Pre 29.5 %    GLOBAL Pre 25.79 %          Scores of 19 and below usually indicate a poorer quality of life in these areas.  A difference of  2-3 points is a clinically meaningful difference.  A difference of 2-3 points in the total score of the Quality of Life Index has been associated with significant improvement in overall quality of life, self-image, physical symptoms, and general health in studies assessing change in quality of life.  PHQ-9: Recent Review Flowsheet Data    Depression screen [facility] 2/9 11/16/2020   Decreased Interest 0   Down, Depressed, Hopeless 0   PHQ - 2 Score 0   Altered sleeping 0   Tired, decreased energy 1   Change in appetite 0   Feeling bad or failure about yourself  0   Trouble concentrating 0   Moving slowly or fidgety/restless 0   Suicidal thoughts 0   PHQ-9 Score 1   Difficult doing work/chores Not difficult at all     Interpretation of Total Score  Total Score Depression Severity:  1-4 = Minimal depression, 5-9 = Mild depression, 10-14 = Moderate depression, 15-19 = Moderately severe depression, 20-27 = Severe depression   Psychosocial Evaluation and Intervention:  Psychosocial Evaluation - 11/13/20 1344      Psychosocial Evaluation & Interventions    Interventions Encouraged to exercise with the program and follow exercise prescription    Comments 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 They have adjusted medications which has helped, but he still has days where he feels dizzy. His MD is aware and they are hoping with time it will adjust itself.  He states this has caused stress since he can't do different activities around the house (ex: get on a ladder and fix things). He wasn't on a lot of medication prior to his TAVR and he is trying to find a new "normal" since he didn't realize for a while that his heart was having issues before surgery. He has also noticed an increase in his tinitis which is very frustrating for him.His sleep pattern has continued to stay stable throughout recovery which he is thankful for.    Expected Outcomes Short: attend cardiac rehab for education and exercise. Long; develop positive self care habits.    Continue Psychosocial Services  Follow up required by staff           Psychosocial Re-Evaluation:  Psychosocial Re-Evaluation    Row Name 11/28/20 [id number] 01/02/21 0755           Psychosocial Re-Evaluation  Current issues with None Identified None Identified      Comments Ronalee Belts is doing well in rehab.  He is feeling good mentally and sleeping well.  He denies any major stressors currently and is recovering well. Ronalee Belts is doing well in rehab.  He is feeling good mentally and sleeping well - he reports no problems except for the pollen bothering him.      Expected Outcomes Short: Continue to exercise for mental boost Long: Continue to stay positive. Short: Continue to exercise for mental boost Long: Continue to stay positive.      Interventions Encouraged to attend Cardiac Rehabilitation for the exercise Encouraged to attend Cardiac Rehabilitation for the exercise      Continue Psychosocial Services  Follow up required by staff Follow up required by staff              Psychosocial Discharge (Final Psychosocial Re-Evaluation):  Psychosocial Re-Evaluation - 01/02/21 0755      Psychosocial Re-Evaluation   Current issues with None Identified    Comments Ronalee Belts is doing well in rehab.  He is feeling good mentally and sleeping well - he reports no problems except for the pollen bothering him.    Expected Outcomes Short: Continue to exercise for mental boost Long: Continue to stay positive.    Interventions Encouraged to attend Cardiac Rehabilitation for the exercise    Continue Psychosocial Services  Follow up required by staff           Vocational Rehabilitation: Provide vocational rehab assistance to qualifying candidates.   Vocational Rehab Evaluation & Intervention:  Vocational Rehab - 11/13/20 1340      Initial Vocational Rehab Evaluation & Intervention   Assessment shows need for Vocational Rehabilitation No           Education: Education Goals: Education classes will be provided on a variety of topics geared toward better understanding of heart health and risk factor modification. Participant will state understanding/return demonstration of topics presented as noted by education test scores.  Learning Barriers/Preferences:  Learning Barriers/Preferences - 11/13/20 1340      Learning Barriers/Preferences   Learning Barriers None    Learning Preferences None           General Cardiac Education Topics:  AED/CPR: - Group verbal and written instruction with the use of models to demonstrate the basic use of the AED with the basic ABC's of resuscitation.   Anatomy and Cardiac Procedures: - Group verbal and visual presentation and models provide information about basic cardiac anatomy and function. Reviews the testing methods done to diagnose heart disease and the outcomes of the test results. Describes the treatment choices: Medical Management, Angioplasty, or Coronary Bypass Surgery for treating various heart conditions including  Myocardial Infarction, Angina, Valve Disease, and Cardiac Arrhythmias.  Written material given at graduation.   Medication Safety: - Group verbal and visual instruction to review commonly prescribed medications for heart and lung disease. Reviews the medication, class of the drug, and side effects. Includes the steps to properly store meds and maintain the prescription regimen.  Written material given at graduation.   Intimacy: - Group verbal instruction through game format to discuss how heart and lung disease can affect sexual intimacy. Written material given at graduation..   Know Your Numbers and Heart Failure: - Group verbal and visual instruction to discuss disease risk factors for cardiac and pulmonary disease and treatment options.  Reviews associated critical values for Overweight/Obesity, Hypertension, Cholesterol, and Diabetes.  Discusses basics of  heart failure: signs/symptoms and treatments.  Introduces Heart Failure Zone chart for action plan for heart failure.  Written material given at graduation.   Infection Prevention: - Provides verbal and written material to individual with discussion of infection control including proper hand washing and proper equipment cleaning during exercise session. Flowsheet Row Cardiac Rehab from 12/28/2020 in [facility] Cardiac and Pulmonary Rehab  Education need identified 11/16/20  Date 11/16/20  Educator [place]  Instruction Review Code 1- Verbalizes Understanding      Falls Prevention: - Provides verbal and written material to individual with discussion of falls prevention and safety. Flowsheet Row Cardiac Rehab from 12/28/2020 in [facility] Cardiac and Pulmonary Rehab  Education need identified 11/16/20  Date 11/16/20  Educator [place]  Instruction Review Code 1- Verbalizes Understanding      Other: -Provides group and verbal instruction on various topics (see comments)   Knowledge Questionnaire Score:  Knowledge Questionnaire Score - 11/16/20 0933       Knowledge Questionnaire Score   Pre Score 26/26           Core Components/Risk Factors/Patient Goals at Admission:  Personal Goals and Risk Factors at Admission - 11/16/20 0951      Core Components/Risk Factors/Patient Goals on Admission    Weight Management Yes;Weight Loss    Intervention Weight Management: Develop a combined nutrition and exercise program designed to reach desired caloric intake, while maintaining appropriate intake of nutrient and fiber, sodium and fats, and appropriate energy expenditure required for the weight goal.;Weight Management: Provide education and appropriate resources to help participant work on and attain dietary goals.;Weight Management/Obesity: Establish reasonable short term and long term weight goals.    Admit Weight 208 lb (94.3 kg)    Goal Weight: Short Term 203 lb (92.1 kg)    Goal Weight: Long Term 198 lb (89.8 kg)    Expected Outcomes Short Term: Continue to assess and modify interventions until short term weight is achieved;Long Term: Adherence to nutrition and physical activity/exercise program aimed toward attainment of established weight goal;Weight Loss: Understanding of general recommendations for a balanced deficit meal plan, which promotes 1-2 lb weight loss per week and includes a negative energy balance of [phone] kcal/d;Understanding recommendations for meals to include 15-35% energy as protein, 25-35% energy from fat, 35-60% energy from carbohydrates, less than 282m of dietary cholesterol, 20-35 gm of total fiber daily;Understanding of distribution of calorie intake throughout the day with the consumption of 4-5 meals/snacks    Hypertension Yes    Intervention Provide education on lifestyle modifcations including regular physical activity/exercise, weight management, moderate sodium restriction and increased consumption of fresh fruit, vegetables, and low fat dairy, alcohol moderation, and smoking cessation.;Monitor prescription use  compliance.    Expected Outcomes Short Term: Continued assessment and intervention until BP is < 140/974mHG in hypertensive participants. < 130/8038mG in hypertensive participants with diabetes, heart failure or chronic kidney disease.;Long Term: Maintenance of blood pressure at goal levels.    Lipids Yes    Intervention Provide education and support for participant on nutrition & aerobic/resistive exercise along with prescribed medications to achieve LDL <4m109mDL >40mg43m Expected Outcomes Short Term: Participant states understanding of desired cholesterol values and is compliant with medications prescribed. Participant is following exercise prescription and nutrition guidelines.;Long Term: Cholesterol controlled with medications as prescribed, with individualized exercise RX and with personalized nutrition plan. Value goals: LDL < 4mg,62m > 40 mg.           Education:Diabetes -  Individual verbal and written instruction to review signs/symptoms of diabetes, desired ranges of glucose level fasting, after meals and with exercise. Acknowledge that pre and post exercise glucose checks will be done for 3 sessions at entry of program.   Core Components/Risk Factors/Patient Goals Review:   Goals and Risk Factor Review    Row Name 11/28/20 0807 01/02/21 0800           Core Components/Risk Factors/Patient Goals Review   Personal Goals Review Weight Management/Obesity;Hypertension;Lipids Weight Management/Obesity;Hypertension;Lipids      Review Curties is doing well in rehab.  His weight is holding steady. His blood pressures are doing well and he is checking them at home. He reports his weight is steady +/-1 or2 pounds. He continues to check his BP at home - typically 120s/60s or 70s. Rehab (today 134/66). He is still taking all of his medications as directed. He has a cardiologist appointment yesterday - they wanted to put him on a medication that would cause him to pay $400 per month, but  rovostatin was giving him GI symptoms - so started a lower dose.      Expected Outcomes Short: Continue to work on weight loss Long: Continue to monitor risk factors Short: Continue to attend rehab, monitor GI symptoms with new medication dose Long: Continue to monitor risk factors             Core Components/Risk Factors/Patient Goals at Discharge (Final Review):   Goals and Risk Factor Review - 01/02/21 0800      Core Components/Risk Factors/Patient Goals Review   Personal Goals Review Weight Management/Obesity;Hypertension;Lipids    Review He reports his weight is steady +/-1 or2 pounds. He continues to check his BP at home - typically 120s/60s or 70s. Rehab (today 134/66). He is still taking all of his medications as directed. He has a cardiologist appointment yesterday - they wanted to put him on a medication that would cause him to pay $400 per month, but rovostatin was giving him GI symptoms - so started a lower dose.    Expected Outcomes Short: Continue to attend rehab, monitor GI symptoms with new medication dose Long: Continue to monitor risk factors           ITP Comments:  ITP Comments    Row Name 11/13/20 1343 11/16/20 0914 11/21/20 0752 12/13/20 1012 01/10/21 0728   ITP Comments Initial telephone orientation completed. Diagnosis can be found in [facility] 12/7. EP orientation scheduled for 3/24 at 8am. Completed 6MWT and gym orientation. Initial ITP created and sent for review to Dr. Emily Filbert, Medical Director. First full day of exercise!  Patient was oriented to gym and equipment including functions, settings, policies, and procedures.  Patient's individual exercise prescription and treatment plan were reviewed.  All starting workloads were established based on the results of the 6 minute walk test done at initial orientation visit.  The plan for exercise progression was also introduced and progression will be customized based on patient's performance and goals. 30 Day review  completed. Medical Director ITP review done, changes made as directed, and signed approval by Medical Director. 30 Day review completed. Medical Director ITP review done, changes made as directed, and signed approval by Medical Director.          Comments:

## 2021-01-11 ENCOUNTER — Other Ambulatory Visit: Payer: Self-pay

## 2021-01-11 DIAGNOSIS — Z952 Presence of prosthetic heart valve: Secondary | ICD-10-CM | POA: Diagnosis not present

## 2021-01-11 NOTE — Progress Notes (Signed)
Daily Session Note  Patient Details  Name: Richard [name] MRN: [id number] Date of Birth: 1951-04-06 Referring Provider:   Flowsheet Row Cardiac Rehab from 11/16/2020 in [facility] Cardiac and Pulmonary Rehab  Referring Provider Neoma Laming MD      Encounter Date: 01/11/2021  Check In:  Session Check In - 01/11/21 0748      Check-In   Supervising physician immediately available to respond to emergencies See telemetry face sheet for immediately available ER MD    Location [facility]-Cardiac & Pulmonary Rehab    Staff Present Birdie Sons, MPA, Mauricia Area, BS, ACSM CEP, Exercise Physiologist;Jessica Luan Pulling, MA, RCEP, CCRP, CCET    Virtual Visit No    Medication changes reported     No    Fall or balance concerns reported    No    Warm-up and Cool-down Performed on first and last piece of equipment    Resistance Training Performed Yes    VAD Patient? No    PAD/SET Patient? No      Pain Assessment   Currently in Pain? No/denies              Social History   Tobacco Use  Smoking Status Never Smoker  Smokeless Tobacco Former Systems developer  . Types: Chew, Snuff    Goals Met:  Independence with exercise equipment Exercise tolerated well No report of cardiac concerns or symptoms Strength training completed today  Goals Unmet:  Not Applicable  Comments: Pt able to follow exercise prescription today without complaint.  Will continue to monitor for progression.    Dr. Emily Filbert is Medical Director for [place] [facility] Pulmonary Rehabilitation.

## 2021-01-16 ENCOUNTER — Other Ambulatory Visit: Payer: Self-pay

## 2021-01-16 DIAGNOSIS — Z952 Presence of prosthetic heart valve: Secondary | ICD-10-CM

## 2021-01-16 NOTE — Progress Notes (Signed)
Daily Session Note  Patient Details  Name: Richard [name] MRN: [id number] Date of Birth: 1951/08/14 Referring Provider:   Flowsheet Row Cardiac Rehab from 11/16/2020 in [facility] Cardiac and Pulmonary Rehab  Referring Provider Neoma Laming MD      Encounter Date: 01/16/2021  Check In:  Session Check In - 01/16/21 0743      Check-In   Supervising physician immediately available to respond to emergencies See telemetry face sheet for immediately available ER MD    Location [facility]-Cardiac & Pulmonary Rehab    Staff Present Birdie Sons, MPA, Elveria Rising, BA, ACSM CEP, Exercise Physiologist;Kara Eliezer Bottom, MS, ASCM CEP, Exercise Physiologist    Virtual Visit No    Medication changes reported     No    Fall or balance concerns reported    No    Warm-up and Cool-down Performed on first and last piece of equipment    Resistance Training Performed Yes    VAD Patient? No    PAD/SET Patient? No      Pain Assessment   Currently in Pain? No/denies              Social History   Tobacco Use  Smoking Status Never Smoker  Smokeless Tobacco Former Systems developer  . Types: Chew, Snuff    Goals Met:  Independence with exercise equipment Exercise tolerated well No report of cardiac concerns or symptoms Strength training completed today  Goals Unmet:  Not Applicable  Comments: Pt able to follow exercise prescription today without complaint.  Will continue to monitor for progression.    Dr. Emily Filbert is Medical Director for [place].  Dr. Ottie Glazier is Medical Director for [facility] Pulmonary Rehabilitation.

## 2021-01-18 ENCOUNTER — Other Ambulatory Visit: Payer: Self-pay

## 2021-01-18 DIAGNOSIS — Z952 Presence of prosthetic heart valve: Secondary | ICD-10-CM

## 2021-01-18 NOTE — Progress Notes (Signed)
Daily Session Note  Patient Details  Name: Richard [name] MRN: [id number] Date of Birth: 02-16-1951 Referring Provider:   Flowsheet Row Cardiac Rehab from 11/16/2020 in [facility] Cardiac and Pulmonary Rehab  Referring Provider Neoma Laming MD      Encounter Date: 01/18/2021  Check In:  Session Check In - 01/18/21 0758      Check-In   Supervising physician immediately available to respond to emergencies See telemetry face sheet for immediately available ER MD    Location [facility]-Cardiac & Pulmonary Rehab    Staff Present Birdie Sons, MPA, RN;Melissa Caiola RDN, Rowe Pavy, BA, ACSM CEP, Exercise Physiologist    Virtual Visit No    Medication changes reported     No    Fall or balance concerns reported    No    Warm-up and Cool-down Performed on first and last piece of equipment    Resistance Training Performed Yes    VAD Patient? No    PAD/SET Patient? No      Pain Assessment   Currently in Pain? No/denies              Social History   Tobacco Use  Smoking Status Never Smoker  Smokeless Tobacco Former Systems developer  . Types: Chew, Snuff    Goals Met:  Independence with exercise equipment Exercise tolerated well No report of cardiac concerns or symptoms Strength training completed today  Goals Unmet:  Not Applicable  Comments: Pt able to follow exercise prescription today without complaint.  Will continue to monitor for progression.    Dr. Emily Filbert is Medical Director for [place].  Dr. Ottie Glazier is Medical Director for [facility] Pulmonary Rehabilitation.

## 2021-01-23 ENCOUNTER — Other Ambulatory Visit: Payer: Self-pay

## 2021-01-23 ENCOUNTER — Encounter: Payer: Medicare Other | Admitting: *Deleted

## 2021-01-23 DIAGNOSIS — Z952 Presence of prosthetic heart valve: Secondary | ICD-10-CM | POA: Diagnosis not present

## 2021-01-23 NOTE — Progress Notes (Signed)
Daily Session Note  Patient Details  Name: Richard [name] MRN: [id number] Date of Birth: 08-23-1951 Referring Provider:   Flowsheet Row Cardiac Rehab from 11/16/2020 in [facility] Cardiac and Pulmonary Rehab  Referring Provider Neoma Laming MD      Encounter Date: 01/23/2021  Check In:  Session Check In - 01/23/21 0827      Check-In   Supervising physician immediately available to respond to emergencies See telemetry face sheet for immediately available ER MD    Location [facility]-Cardiac & Pulmonary Rehab    Staff Present Heath Lark, RN, BSN, Jacklynn Bue, MS, ASCM CEP, Exercise Physiologist;Melissa Caiola RDN, LDN    Virtual Visit No    Medication changes reported     No    Fall or balance concerns reported    No    Warm-up and Cool-down Performed on first and last piece of equipment    Resistance Training Performed Yes    VAD Patient? No    PAD/SET Patient? No      Pain Assessment   Currently in Pain? No/denies              Social History   Tobacco Use  Smoking Status Never Smoker  Smokeless Tobacco Former Systems developer  . Types: Chew, Snuff    Goals Met:  Independence with exercise equipment Exercise tolerated well No report of cardiac concerns or symptoms  Goals Unmet:  Not Applicable  Comments: Pt able to follow exercise prescription today without complaint.  Will continue to monitor for progression.    Dr. Emily Filbert is Medical Director for [place].  Dr. Ottie Glazier is Medical Director for [facility] Pulmonary Rehabilitation.

## 2021-01-25 ENCOUNTER — Encounter: Payer: Medicare Other | Attending: Cardiovascular Disease | Admitting: *Deleted

## 2021-01-25 ENCOUNTER — Other Ambulatory Visit: Payer: Self-pay

## 2021-01-25 DIAGNOSIS — Z952 Presence of prosthetic heart valve: Secondary | ICD-10-CM | POA: Insufficient documentation

## 2021-01-25 NOTE — Progress Notes (Signed)
Daily Session Note  Patient Details  Name: PATRYCK KILGORE MRN: [id number] Date of Birth: 10-18-1950 Referring Provider:   Flowsheet Row Cardiac Rehab from 11/16/2020 in [facility] Cardiac and Pulmonary Rehab  Referring Provider Neoma Laming MD      Encounter Date: 01/25/2021  Check In:  Session Check In - 01/25/21 1134      Check-In   Supervising physician immediately available to respond to emergencies See telemetry face sheet for immediately available ER MD    Location [facility]-Cardiac & Pulmonary Rehab    Staff Present Heath Lark, RN, BSN, Laveda Norman, BS, ACSM CEP, Exercise Physiologist;Amanda Oletta Darter, [place], ACSM CEP, Exercise Physiologist    Virtual Visit No    Medication changes reported     No    Fall or balance concerns reported    No    Warm-up and Cool-down Performed on first and last piece of equipment    Resistance Training Performed Yes    VAD Patient? No    PAD/SET Patient? No      Pain Assessment   Currently in Pain? No/denies              Social History   Tobacco Use  Smoking Status Never Smoker  Smokeless Tobacco Former Systems developer  . Types: Chew, Snuff    Goals Met:  Independence with exercise equipment Exercise tolerated well No report of cardiac concerns or symptoms  Goals Unmet:  Not Applicable  Comments: Pt able to follow exercise prescription today without complaint.  Will continue to monitor for progression.    Dr. Emily Filbert is Medical Director for [place].  Dr. Ottie Glazier is Medical Director for [facility] Pulmonary Rehabilitation.

## 2021-01-30 ENCOUNTER — Other Ambulatory Visit: Payer: Self-pay

## 2021-01-30 DIAGNOSIS — Z952 Presence of prosthetic heart valve: Secondary | ICD-10-CM

## 2021-01-30 NOTE — Progress Notes (Signed)
Daily Session Note  Patient Details  Name: Richard [name] MRN: [id number] Date of Birth: 10/07/1950 Referring Provider:   Flowsheet Row Cardiac Rehab from 11/16/2020 in [facility]  Referring Provider Khan, Shaukat MD      Encounter Date: 01/30/2021  Check In:  Session Check In - 01/30/21 0740      Check-In   Supervising physician immediately available to respond to emergencies See telemetry face sheet for immediately available ER MD    Location [facility]    Staff Present Kelly Bollinger, MPA, RN;Amanda Sommer, BA, ACSM CEP, Exercise Physiologist;Kara Langdon, MS, ASCM CEP, Exercise Physiologist    Virtual Visit No    Medication changes reported     No    Fall or balance concerns reported    No    Warm-up and Cool-down Performed on first and last piece of equipment    Resistance Training Performed Yes    VAD Patient? No    PAD/SET Patient? No      Pain Assessment   Currently in Pain? No/denies              Social History   Tobacco Use  Smoking Status Never Smoker  Smokeless Tobacco Former User  . Types: Chew, Snuff    Goals Met:  Independence with exercise equipment Exercise tolerated well Personal goals reviewed No report of cardiac concerns or symptoms Strength training completed today  Goals Unmet:  Not Applicable  Comments: Pt able to follow exercise prescription today without complaint.  Will continue to monitor for progression.    Dr. Mark Miller is Medical Director for [facility].  Dr. Fuad Aleskerov is Medical Director for [facility]. 

## 2021-02-01 ENCOUNTER — Other Ambulatory Visit: Payer: Self-pay

## 2021-02-01 DIAGNOSIS — Z952 Presence of prosthetic heart valve: Secondary | ICD-10-CM

## 2021-02-01 NOTE — Progress Notes (Signed)
Daily Session Note  Patient Details  Name: Richard [name] MRN: [id number] Date of Birth: [birth year] Referring Provider:   Flowsheet Row Cardiac Rehab from 11/16/2020 in [facility]  Referring Provider Neoma Laming MD       Encounter Date: 02/01/2021  Check In:  Session Check In - 02/01/21 0745       Check-In   Supervising physician immediately available to respond to emergencies See telemetry face sheet for immediately available ER MD    Location [facility]    Staff Present Birdie Sons, MPA, Mauricia Area, BS, ACSM CEP, Exercise Physiologist;Amanda Oletta Darter, BA, ACSM CEP, Exercise Physiologist    Virtual Visit No    Medication changes reported     No    Fall or balance concerns reported    No    Warm-up and Cool-down Performed on first and last piece of equipment    Resistance Training Performed Yes    VAD Patient? No    PAD/SET Patient? No      Pain Assessment   Currently in Pain? No/denies                Social History   Tobacco Use  Smoking Status Never  Smokeless Tobacco Former   Types: Chew, Snuff    Goals Met:  Independence with exercise equipment Exercise tolerated well No report of cardiac concerns or symptoms Strength training completed today  Goals Unmet:  Not Applicable  Comments: Pt able to follow exercise prescription today without complaint.  Will continue to monitor for progression.    Dr. Emily Filbert is Medical Director for [facility].  Dr. Ottie Glazier is Medical Director for [facility].

## 2021-02-06 ENCOUNTER — Other Ambulatory Visit: Payer: Self-pay

## 2021-02-06 DIAGNOSIS — Z952 Presence of prosthetic heart valve: Secondary | ICD-10-CM | POA: Diagnosis not present

## 2021-02-06 NOTE — Progress Notes (Signed)
Daily Session Note  Patient Details  Name: Richard [name] MRN: [id number] Date of Birth: 1951/07/25 Referring Provider:   Flowsheet Row Cardiac Rehab from 11/16/2020 in [facility]  Referring Provider Neoma Laming MD       Encounter Date: 02/06/2021  Check In:  Session Check In - 02/06/21 0806       Check-In   Supervising physician immediately available to respond to emergencies See telemetry face sheet for immediately available ER MD    Location [facility]    Staff Present Birdie Sons, MPA, RN;Laureen Owens Shark, BS, RRT, CPFT;Kara Eliezer Bottom, MS, ASCM CEP, Exercise Physiologist    Virtual Visit No    Medication changes reported     No    Fall or balance concerns reported    No    Warm-up and Cool-down Performed on first and last piece of equipment    Resistance Training Performed Yes    VAD Patient? No    PAD/SET Patient? No      Pain Assessment   Currently in Pain? No/denies                Social History   Tobacco Use  Smoking Status Never  Smokeless Tobacco Former   Types: Chew, Snuff    Goals Met:  Independence with exercise equipment Exercise tolerated well No report of cardiac concerns or symptoms Strength training completed today  Goals Unmet:  Not Applicable  Comments: Pt able to follow exercise prescription today without complaint.  Will continue to monitor for progression.    Dr. Emily Filbert is Medical Director for [facility].  Dr. Ottie Glazier is Medical Director for [facility].

## 2021-02-07 ENCOUNTER — Encounter: Payer: Self-pay | Admitting: *Deleted

## 2021-02-07 DIAGNOSIS — Z952 Presence of prosthetic heart valve: Secondary | ICD-10-CM

## 2021-02-07 NOTE — Progress Notes (Signed)
Cardiac Individual Treatment Plan  Patient Details  Name: Richard [name] MRN: [id number] Date of Birth: 1951-03-27 Referring Provider:   Flowsheet Row Cardiac Rehab from 11/16/2020 in [facility] Cardiac and Pulmonary Rehab  Referring Provider Neoma Laming MD       Initial Encounter Date:  Flowsheet Row Cardiac Rehab from 11/16/2020 in [facility] Cardiac and Pulmonary Rehab  Date 11/16/20       Visit Diagnosis: S/P TAVR (transcatheter aortic valve replacement)  Patient's Home Medications on Admission:  Current Outpatient Medications:    amLODipine (NORVASC) 10 MG tablet, Take 1 tablet by mouth daily., Disp: , Rfl:    amLODipine (NORVASC) 5 MG tablet, Take 1 tablet (5 mg total) by mouth daily. (Patient not taking: Reported on 11/13/2020), Disp: 30 tablet, Rfl: 2   amoxicillin (AMOXIL) 500 MG capsule, Take by mouth., Disp: , Rfl:    aspirin 81 MG chewable tablet, Chew by mouth., Disp: , Rfl:    Cholecalciferol (VITAMIN D3) 50 MCG (2000 UT) TABS, Take 2,000 Units by mouth 3 (three) times a week., Disp: , Rfl:    Coenzyme Q10 (CO Q 10) 100 MG CAPS, Take 100 mg by mouth daily., Disp: , Rfl:    losartan (COZAAR) 50 MG tablet, Take 1 tablet by mouth daily., Disp: , Rfl:    losartan-hydrochlorothiazide (HYZAAR) 50-12.5 MG tablet, Take 1 tablet by mouth daily. (Patient not taking: Reported on 11/13/2020), Disp: , Rfl:    rosuvastatin (CRESTOR) 20 MG tablet, Take 20 mg by mouth at bedtime., Disp: , Rfl:  No current facility-administered medications for this visit.  Facility-Administered Medications Ordered in Other Visits:    sodium chloride flush (NS) 0.9 % injection 3 mL, 3 mL, Intravenous, Q12H, Dionisio David, MD  Past Medical History: Past Medical History:  Diagnosis Date   Aortic stenosis    Hyperlipidemia    Hypertension    Mitral valve regurgitation     Tobacco Use: Social History   Tobacco Use  Smoking Status Never  Smokeless Tobacco Former   Types: Chew, Snuff     Labs: Recent Review Flowsheet Data     Labs for ITP Cardiac and Pulmonary Rehab Latest Ref Rng & Units 05/14/2020   Cholestrol 0 - 200 mg/dL 329(H)   LDLCALC 0 - 99 mg/dL 251(H)   HDL >40 mg/dL 41   Trlycerides <150 mg/dL 186(H)        Exercise Target Goals: Exercise Program Goal: Individual exercise prescription set using results from initial 6 min walk test and THRR while considering  patient's activity barriers and safety.   Exercise Prescription Goal: Initial exercise prescription builds to 30-45 minutes a day of aerobic activity, 2-3 days per week.  Home exercise guidelines will be given to patient during program as part of exercise prescription that the participant will acknowledge.   Education: Aerobic Exercise: - Group verbal and visual presentation on the components of exercise prescription. Introduces F.I.T.T principle from ACSM for exercise prescriptions.  Reviews F.I.T.T. principles of aerobic exercise including progression. Written material given at graduation.   Education: Resistance Exercise: - Group verbal and visual presentation on the components of exercise prescription. Introduces F.I.T.T principle from ACSM for exercise prescriptions  Reviews F.I.T.T. principles of resistance exercise including progression. Written material given at graduation.    Education: Exercise & Equipment Safety: - Individual verbal instruction and demonstration of equipment use and safety with use of the equipment. Flowsheet Row Cardiac Rehab from 01/25/2021 in [facility] Cardiac and Pulmonary Rehab  Education need identified  11/16/20  Date 11/16/20  Educator KL  Instruction Review Code 1- Verbalizes Understanding       Education: Exercise Physiology & General Exercise Guidelines: - Group verbal and written instruction with models to review the exercise physiology of the cardiovascular system and associated critical values. Provides general exercise guidelines with specific guidelines  to those with heart or lung disease.    Education: Flexibility, Balance, Mind/Body Relaxation: - Group verbal and visual presentation with interactive activity on the components of exercise prescription. Introduces F.I.T.T principle from ACSM for exercise prescriptions. Reviews F.I.T.T. principles of flexibility and balance exercise training including progression. Also discusses the mind body connection.  Reviews various relaxation techniques to help reduce and manage stress (i.e. Deep breathing, progressive muscle relaxation, and visualization). Balance handout provided to take home. Written material given at graduation. Flowsheet Row Cardiac Rehab from 01/25/2021 in [facility] Cardiac and Pulmonary Rehab  Date 12/28/20  Educator AS  Instruction Review Code 1- Verbalizes Understanding       Activity Barriers & Risk Stratification:  Activity Barriers & Cardiac Risk Stratification - 11/16/20 0941       Activity Barriers & Cardiac Risk Stratification   Activity Barriers Deconditioning;Muscular Weakness    Cardiac Risk Stratification Moderate             6 Minute Walk:  6 Minute Walk     Row Name 11/16/20 0938         6 Minute Walk   Phase Initial     Distance 1470 feet     Walk Time 6 minutes     # of Rest Breaks 0     MPH 2.78     METS 3.26     RPE 11     Perceived Dyspnea  0     VO2 Peak 11.41     Symptoms No     Resting HR 67 bpm     Resting BP 128/68     Resting Oxygen Saturation  98 %     Exercise Oxygen Saturation  during 6 min walk 98 %     Max Ex. HR 87 bpm     Max Ex. BP 152/68     2 Minute Post BP 134/68              Oxygen Initial Assessment:   Oxygen Re-Evaluation:   Oxygen Discharge (Final Oxygen Re-Evaluation):   Initial Exercise Prescription:  Initial Exercise Prescription - 11/16/20 0900       Date of Initial Exercise RX and Referring Provider   Date 11/16/20    Referring Provider Neoma Laming MD      Treadmill   MPH 2.7    Grade  0.5    Minutes 15    METs 3.25      Recumbant Bike   Level 3    RPM 60    Watts 25    Minutes 15    METs 3.2      NuStep   Level 3    SPM 80    Minutes 15    METs 3.2      T5 Nustep   Level 2    SPM 80    Minutes 15    METs 3.2      Prescription Details   Frequency (times per week) 2    Duration Progress to 30 minutes of continuous aerobic without signs/symptoms of physical distress      Intensity   THRR 40-80% of Max Heartrate 101-135  Ratings of Perceived Exertion 11-13    Perceived Dyspnea 0-4      Progression   Progression Continue to progress workloads to maintain intensity without signs/symptoms of physical distress.      Resistance Training   Training Prescription Yes    Weight 3 lb    Reps 10-15             Perform Capillary Blood Glucose checks as needed.  Exercise Prescription Changes:   Exercise Prescription Changes     Row Name 11/16/20 0900 11/21/20 1600 11/28/20 0800 12/05/20 1500 12/21/20 1400     Response to Exercise   Blood Pressure (Admit) 128/68 142/70 -- 138/66 124/68   Blood Pressure (Exercise) 152/68 142/64 -- 144/68 164/74   Blood Pressure (Exit) 134/68 126/68 -- 124/68 124/66   Heart Rate (Admit) 67 bpm 80 bpm -- 77 bpm 66 bpm   Heart Rate (Exercise) 87 bpm 102 bpm -- 105 bpm 102 bpm   Heart Rate (Exit) 73 bpm 85 bpm -- 84 bpm 72 bpm   Oxygen Saturation (Admit) 98 % -- -- -- --   Oxygen Saturation (Exercise) 98 % -- -- -- --   Oxygen Saturation (Exit) 98 % -- -- -- --   Rating of Perceived Exertion (Exercise) 11 13 -- 13 13   Perceived Dyspnea (Exercise) 0 -- -- -- --   Symptoms none none -- none none   Comments walk test results first full day of exercise -- -- --   Duration -- Progress to 30 minutes of  aerobic without signs/symptoms of physical distress -- Continue with 30 min of aerobic exercise without signs/symptoms of physical distress. Continue with 30 min of aerobic exercise without signs/symptoms of physical  distress.   Intensity -- THRR unchanged -- THRR unchanged THRR unchanged     Progression   Progression -- Continue to progress workloads to maintain intensity without signs/symptoms of physical distress. -- Continue to progress workloads to maintain intensity without signs/symptoms of physical distress. Continue to progress workloads to maintain intensity without signs/symptoms of physical distress.   Average METs -- 2.63 -- 2.91 3.46     Resistance Training   Training Prescription -- Yes -- Yes Yes   Weight -- 3 lb -- 3 lb 5 lb   Reps -- 10-15 -- 10-15 10-15     Interval Training   Interval Training -- No -- No No     Treadmill   MPH -- 2.7 -- 2.9 3.5   Grade -- 0.5 -- 0.5 0.5   Minutes -- 15 -- 15 15   METs -- 3.25 -- 3.42 3.92     Recumbant Bike   Level -- -- -- -- 7   Watts -- -- -- -- 48   Minutes -- -- -- -- 15   METs -- -- -- -- 6.75     NuStep   Level -- -- -- 4 --   SPM -- -- -- 80 --   Minutes -- -- -- 15 --   METs -- -- -- 2.4 --     T5 Nustep   Level -- 3 -- -- 6   Minutes -- 15 -- -- 15   METs -- 2 -- -- 2.9     Home Exercise Plan   Plans to continue exercise at -- -- Home (comment)  walking, staff videos Home (comment)  walking, staff videos Home (comment)  walking, staff videos   Frequency -- -- Add 3 additional days to program exercise  sessions. Add 3 additional days to program exercise sessions. Add 3 additional days to program exercise sessions.   Initial Home Exercises Provided -- -- 11/28/20 11/28/20 11/28/20    Row Name 01/03/21 1400 01/18/21 0900 01/31/21 1600         Response to Exercise   Blood Pressure (Admit) 134/66 118/70 130/68     Blood Pressure (Exercise) 182/68  recheck 154/68 206/76  154/72 162/68     Blood Pressure (Exit) 144/68 114/64 122/66     Heart Rate (Admit) 71 bpm 67 bpm 71 bpm     Heart Rate (Exercise) 96 bpm 104 bpm 106 bpm     Heart Rate (Exit) 93 bpm 86 bpm 86 bpm     Rating of Perceived Exertion (Exercise) _0 Symptoms none none none     Duration Continue with 30 min of aerobic exercise without signs/symptoms of physical distress. Continue with 30 min of aerobic exercise without signs/symptoms of physical distress. Continue with 30 min of aerobic exercise without signs/symptoms of physical distress.     Intensity THRR unchanged THRR unchanged THRR unchanged           Progression       Progression Continue to progress workloads to maintain intensity without signs/symptoms of physical distress. Continue to progress workloads to maintain intensity without signs/symptoms of physical distress. Continue to progress workloads to maintain intensity without signs/symptoms of physical distress.     Average METs 5.78 4.63 3.35           Resistance Training       Training Prescription Yes Yes Yes     Weight 5 lb 6 lb 6 lb     Reps 10-15 10-15 10-15           Interval Training       Interval Training No No No           Treadmill       MPH 3.5 3.5 --     Grade 1 1.5 --     Minutes 15 15 --     METs 4.16 4.4 --           Recumbant Bike       Level -- 8 8     Watts -- 55 71     Minutes -- 15 15     METs -- 4.54 4.5           REL-XR       Level 9 8 --     Minutes 15 15 --     METs 7.4 6.2 --           T5 Nustep       Level -- 6 6     Minutes -- 15 15     METs -- 3.6 2.8           Home Exercise Plan       Plans to continue exercise at -- Home (comment)  walking, staff videos Home (comment)  walking, staff videos     Frequency -- Add 3 additional days to program exercise sessions. Add 3 additional days to program exercise sessions.     Initial Home Exercises Provided -- 11/28/20 11/28/20             Exercise Comments:   Exercise Comments     Row Name 11/21/20 5681           Exercise Comments First full day  of exercise!  Patient was oriented to gym and equipment including functions, settings, policies, and procedures.  Patient's individual exercise prescription and treatment  plan were reviewed.  All starting workloads were established based on the results of the 6 minute walk test done at initial orientation visit.  The plan for exercise progression was also introduced and progression will be customized based on patient's performance and goals.                Exercise Goals and Review:   Exercise Goals     Row Name 11/16/20 0950             Exercise Goals   Increase Physical Activity Yes       Intervention Provide advice, education, support and counseling about physical activity/exercise needs.;Develop an individualized exercise prescription for aerobic and resistive training based on initial evaluation findings, risk stratification, comorbidities and participant's personal goals.       Expected Outcomes Short Term: Attend rehab on a regular basis to increase amount of physical activity.;Long Term: Add in home exercise to make exercise part of routine and to increase amount of physical activity.;Long Term: Exercising regularly at least 3-5 days a week.       Increase Strength and Stamina Yes       Intervention Provide advice, education, support and counseling about physical activity/exercise needs.;Develop an individualized exercise prescription for aerobic and resistive training based on initial evaluation findings, risk stratification, comorbidities and participant's personal goals.       Expected Outcomes Short Term: Increase workloads from initial exercise prescription for resistance, speed, and METs.;Short Term: Perform resistance training exercises routinely during rehab and add in resistance training at home;Long Term: Improve cardiorespiratory fitness, muscular endurance and strength as measured by increased METs and functional capacity (6MWT)       Able to understand and use rate of perceived exertion (RPE) scale Yes       Intervention Provide education and explanation on how to use RPE scale       Expected Outcomes Short Term: Able to use RPE daily  in rehab to express subjective intensity level;Long Term:  Able to use RPE to guide intensity level when exercising independently       Able to understand and use Dyspnea scale Yes       Intervention Provide education and explanation on how to use Dyspnea scale       Expected Outcomes Short Term: Able to use Dyspnea scale daily in rehab to express subjective sense of shortness of breath during exertion;Long Term: Able to use Dyspnea scale to guide intensity level when exercising independently       Knowledge and understanding of Target Heart Rate Range (THRR) Yes       Intervention Provide education and explanation of THRR including how the numbers were predicted and where they are located for reference       Expected Outcomes Short Term: Able to state/look up THRR;Short Term: Able to use daily as guideline for intensity in rehab;Long Term: Able to use THRR to govern intensity when exercising independently       Able to check pulse independently Yes       Intervention Provide education and demonstration on how to check pulse in carotid and radial arteries.;Review the importance of being able to check your own pulse for safety during independent exercise       Expected Outcomes Short Term: Able to explain why pulse checking is important during independent exercise;Long Term:  Able to check pulse independently and accurately       Understanding of Exercise Prescription Yes       Intervention Provide education, explanation, and written materials on patient's individual exercise prescription       Expected Outcomes Short Term: Able to explain program exercise prescription;Long Term: Able to explain home exercise prescription to exercise independently                Exercise Goals Re-Evaluation :  Exercise Goals Re-Evaluation     Row Name 11/21/20 0752 11/28/20 0812 12/05/20 1518 12/21/20 1344 01/02/21 0804     Exercise Goal Re-Evaluation   Exercise Goals Review Increase Physical Activity;Able to  understand and use rate of perceived exertion (RPE) scale;Knowledge and understanding of Target Heart Rate Range (THRR);Understanding of Exercise Prescription;Increase Strength and Stamina;Able to understand and use Dyspnea scale;Able to check pulse independently Increase Physical Activity;Increase Strength and Stamina;Understanding of Exercise Prescription;Able to understand and use rate of perceived exertion (RPE) scale;Able to understand and use Dyspnea scale;Knowledge and understanding of Target Heart Rate Range (THRR);Able to check pulse independently Increase Physical Activity;Increase Strength and Stamina Increase Physical Activity;Increase Strength and Stamina;Understanding of Exercise Prescription Increase Physical Activity;Increase Strength and Stamina;Understanding of Exercise Prescription   Comments Reviewed RPE and dyspnea scales, THR and program prescription with pt today.  Pt voiced understanding and was given a copy of goals to take home. Reviewed home exercise with pt today.  Pt plans to walk and use weights for exercise. We also talked about using the staff videos on bad weather days.  Reviewed THR, pulse, RPE, sign and symptoms, pulse oximetery and when to call 911 or MD.  Also discussed weather considerations and indoor options.  Pt voiced understanding. Ronalee Belts is progressing well with exercise. He has increased levels on TM and seated machines.  Staff wil lmonitor progress. Ronalee Belts is doing well in rehab.  Ronalee Belts is doing level 7 on the bike and using 5 lb hand weights.  We will continue to monitor his progress. He walks at home on the days he is not here except for mondays and fridays - 2 miles with warm up and cool down lap as well as 6 laps of higher intensity walking (~40 minutes, RPE 13). just short of running. 8lb weights at home with the same days as aerobic exercise. He is also very active during the day.   Expected Outcomes Short: Use RPE daily to regulate intensity. Long: Follow program  prescription in THR. Short: Start to add in exercise at home Long; Continue to follow program prescription Short: continue to attend consistently Long:  continue to build stamina Short: Continue to move up on workloads.  Long: Continue to build stamina Short: Continue to move up on workloads, continue to exercise at home  Long: Continue to build stamina    Row Name 01/03/21 1415 01/18/21 0902 01/30/21 0808         Exercise Goal Re-Evaluation   Exercise Goals Review Increase Physical Activity;Increase Strength and Stamina Increase Physical Activity;Increase Strength and Stamina;Understanding of Exercise Prescription Increase Physical Activity;Increase Strength and Stamina;Understanding of Exercise Prescription     Comments Brentin is progressing well and is up to level 9 on XR.  Staff will encourage trying 6 lb for strength work. Ronalee Belts is doing well in rehab.  He is up to level 6 on the NuStep.  We will continue to monitor his progress. He walks at home on the days he is not here except for mondays and  fridays (however will sometimes walk these days too) - 2 miles with warm up and cool down lap as well as 6 laps of higher intensity walking (~40 minutes, RPE 13). Just short of running. 8lb weights at home with the same days as aerobic exercise. He is also very active during the day.     Expected Outcomes Short: move up to 6 lb for sterngth work Long:  increase overall MET level Short: Talk about intervals Long: Continue to improve stamina Short: Talk about intervals Long: Continue to improve stamina              Discharge Exercise Prescription (Final Exercise Prescription Changes):  Exercise Prescription Changes - 01/31/21 1600       Response to Exercise   Blood Pressure (Admit) 130/68    Blood Pressure (Exercise) 162/68    Blood Pressure (Exit) 122/66    Heart Rate (Admit) 71 bpm    Heart Rate (Exercise) 106 bpm    Heart Rate (Exit) 86 bpm    Rating of Perceived Exertion (Exercise) 13     Symptoms none    Duration Continue with 30 min of aerobic exercise without signs/symptoms of physical distress.    Intensity THRR unchanged      Progression   Progression Continue to progress workloads to maintain intensity without signs/symptoms of physical distress.    Average METs 3.35      Resistance Training   Training Prescription Yes    Weight 6 lb    Reps 10-15      Interval Training   Interval Training No      Recumbant Bike   Level 8    Watts 71    Minutes 15    METs 4.5      T5 Nustep   Level 6    Minutes 15    METs 2.8      Home Exercise Plan   Plans to continue exercise at Home (comment)   walking, staff videos   Frequency Add 3 additional days to program exercise sessions.    Initial Home Exercises Provided 11/28/20             Nutrition:  Target Goals: Understanding of nutrition guidelines, daily intake of sodium <1558m, cholesterol <2075m calories 30% from fat and 7% or less from saturated fats, daily to have 5 or more servings of fruits and vegetables.  Education: All About Nutrition: -Group instruction provided by verbal, written material, interactive activities, discussions, models, and posters to present general guidelines for heart healthy nutrition including fat, fiber, MyPlate, the role of sodium in heart healthy nutrition, utilization of the nutrition label, and utilization of this knowledge for meal planning. Follow up email sent as well. Written material given at graduation.   Biometrics:  Pre Biometrics - 11/16/20 0940       Pre Biometrics   Height _0  (1.778 m)    Weight 208 lb 11.2 oz (94.7 kg)    BMI (Calculated) 29.95    Single Leg Stand 30 seconds              Nutrition Therapy Plan and Nutrition Goals:  Nutrition Therapy & Goals - 11/28/20 0846       Nutrition Therapy   Diet Heart healthy, low Na    Drug/Food Interactions Statins/Certain Fruits    Protein (specify units) 75g    Fiber 30 grams    Whole Grain  Foods 3 servings    Saturated Fats 12 max. grams  Fruits and Vegetables 8 servings/day    Sodium 1.5 grams      Personal Nutrition Goals   Nutrition Goal ST: calculate the amount of salt eaten day to day, having snack with afternoon coffee with protein, fat, and fiber - fruit and nuts, 1/2 sandwich, etc.  LT: limit salt intake to < 1.5g/day    Comments Kirt is a 70 yo male admitted to rehab with aortic stenosis; other conditions include HTN and HLD. Breakfast: 7am Egg - with some butter with english muffin (sourdough) or whole wheat bread or white flour tortilla or oatmeal or great grains cereal with 2% milk dried cherries or pecans. usually the egg option. Coffee - splash of 2% milk and water. Lunch 1130: variety - leftovers from night before or cold cut sandwich (whole wheat bread most of the time - [place] or smoked ham) or soup - canned (heart healthy kind). Diet pepsi. Snack: afternoon coffee. Snack: banana or small piece of chocolate or sliced peaches 4pm due to feeling dizzy and irritable. Dinner 6-630pm: variety - pasta on sundays (1/3 pasta, protein, non-starchy vegetables), mondays leftover pasta, salads (3 servings of vegetables), potroast, chicken, salmon, potstickers, risotto full of vegetables, hamburger with oven roasted fries, out to dinner 1x/week on fridays. Diet coke with dinner. He reports eating many different kinds of vegetables like beets, greens, brussells sprouts and only disliking peas. Slow cooker, sautee, roasting, baking, seldom fried. Uses olive oil or butter. He feels that butter and salt are his weeknesses. Uses salt while cooking - pinch here and there - he does not feel he has overly salted food. Drinks during the day: water. He rpeorts eating a variety of nuts and beans as well. Discussed heart healthy eating.      Intervention Plan   Intervention Prescribe, educate and counsel regarding individualized specific dietary modifications aiming towards targeted core  components such as weight, hypertension, lipid management, diabetes, heart failure and other comorbidities.;Nutrition handout(s) given to patient.    Expected Outcomes Short Term Goal: Understand basic principles of dietary content, such as calories, fat, sodium, cholesterol and nutrients.;Short Term Goal: A plan has been developed with personal nutrition goals set during dietitian appointment.;Long Term Goal: Adherence to prescribed nutrition plan.             Nutrition Assessments:  MEDIFICTS Score Key: ?70 Need to make dietary changes  40-70 Heart Healthy Diet ? 40 Therapeutic Level Cholesterol Diet  Flowsheet Row Cardiac Rehab from 11/16/2020 in [facility] Cardiac and Pulmonary Rehab  Picture Your Plate Total Score on Admission 68      Picture Your Plate Scores: <14 Unhealthy dietary pattern with much room for improvement. 41-50 Dietary pattern unlikely to meet recommendations for good health and room for improvement. 51-60 More healthful dietary pattern, with some room for improvement.  >60 Healthy dietary pattern, although there may be some specific behaviors that could be improved.    Nutrition Goals Re-Evaluation:  Nutrition Goals Re-Evaluation     [place] Name 01/02/21 0758 01/30/21 0806           Goals   Current Weight -- 214 lb 6.4 oz (97.3 kg)      Nutrition Goal ST: continue working on reducing salt. LT: limit salt intake to < 1.5g/day ST: continue working on reducing salt. LT: limit salt intake to < 1.5g/day      Comment He reports no changes other than limiting added salt with meals since we last spoke and he reports doing well and feels  his diet is healthy and would not like to make any toher changes at this time. He reports still limiting salt, but enjoys the flavor - discussed some tips to help with flavor, reports he is aware of these techniques. He feels he is doing well with his diet and would not like to make any further changes at this time.      Expected Outcome  ST: continue working on reducing salt. LT: limit salt intake to < 1.5g/day ST: continue working on reducing salt. LT: limit salt intake to < 1.5g/day               Nutrition Goals Discharge (Final Nutrition Goals Re-Evaluation):  Nutrition Goals Re-Evaluation - 01/30/21 0806       Goals   Current Weight 214 lb 6.4 oz (97.3 kg)    Nutrition Goal ST: continue working on reducing salt. LT: limit salt intake to < 1.5g/day    Comment He reports still limiting salt, but enjoys the flavor - discussed some tips to help with flavor, reports he is aware of these techniques. He feels he is doing well with his diet and would not like to make any further changes at this time.    Expected Outcome ST: continue working on reducing salt. LT: limit salt intake to < 1.5g/day             Psychosocial: Target Goals: Acknowledge presence or absence of significant depression and/or stress, maximize coping skills, provide positive support system. Participant is able to verbalize types and ability to use techniques and skills needed for reducing stress and depression.   Education: Stress, Anxiety, and Depression - Group verbal and visual presentation to define topics covered.  Reviews how body is impacted by stress, anxiety, and depression.  Also discusses healthy ways to reduce stress and to treat/manage anxiety and depression.  Written material given at graduation. Flowsheet Row Cardiac Rehab from 01/25/2021 in [facility] Cardiac and Pulmonary Rehab  Date 11/30/20  Educator [facility]  Instruction Review Code 1- [employer] Understanding       Education: Sleep Hygiene -Provides group verbal and written instruction about how sleep can affect your health.  Define sleep hygiene, discuss sleep cycles and impact of sleep habits. Review good sleep hygiene tips.    Initial Review & Psychosocial Screening:  Initial Psych Review & Screening - 11/13/20 1340       Initial Review   Current issues with Current Stress  Concerns    Source of Stress Concerns Unable to participate in former interests or hobbies;Unable to perform yard/household activities      [place]? Yes   wife     Barriers   Psychosocial barriers to participate in program There are no identifiable barriers or psychosocial needs.;The patient should benefit from training in stress management and relaxation.      Screening Interventions   Interventions Encouraged to exercise;Provide feedback about the scores to participant;To provide support and resources with identified psychosocial needs    Expected Outcomes Short Term goal: Utilizing psychosocial counselor, staff and physician to assist with identification of specific Stressors or current issues interfering with healing process. Setting desired goal for each stressor or current issue identified.;Long Term Goal: Stressors or current issues are controlled or eliminated.;Short Term goal: Identification and review with participant of any Quality of Life or Depression concerns found by scoring the questionnaire.;Long Term goal: The participant improves quality of Life and PHQ9 Scores as seen by post scores and/or verbalization  of changes             Quality of Life Scores:   Quality of Life - 11/16/20 0933       Quality of Life   Select Quality of Life      Quality of Life Scores   Health/Function Pre 25.33 %    Socioeconomic Pre 24.99 %    Psych/Spiritual Pre 25.36 %    Family Pre 29.5 %    GLOBAL Pre 25.79 %            Scores of 19 and below usually indicate a poorer quality of life in these areas.  A difference of  2-3 points is a clinically meaningful difference.  A difference of 2-3 points in the total score of the Quality of Life Index has been associated with significant improvement in overall quality of life, self-image, physical symptoms, and general health in studies assessing change in quality of life.  PHQ-9: Recent Review Flowsheet Data      Depression screen [facility] 2/9 11/16/2020   Decreased Interest 0   Down, Depressed, Hopeless 0   PHQ - 2 Score 0   Altered sleeping 0   Tired, decreased energy 1   Change in appetite 0   Feeling bad or failure about yourself  0   Trouble concentrating 0   Moving slowly or fidgety/restless 0   Suicidal thoughts 0   PHQ-9 Score 1   Difficult doing work/chores Not difficult at all      Interpretation of Total Score  Total Score Depression Severity:  1-4 = Minimal depression, 5-9 = Mild depression, 10-14 = Moderate depression, 15-19 = Moderately severe depression, 20-27 = Severe depression   Psychosocial Evaluation and Intervention:  Psychosocial Evaluation - 11/13/20 1344       Psychosocial Evaluation & Interventions   Interventions Encouraged to exercise with the program and follow exercise prescription    Comments 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 They have adjusted medications which has helped, but he still has days where he feels dizzy. His MD is aware and they are hoping with time it will adjust itself.  He states this has caused stress since he can't do different activities around the house (ex: get on a ladder and fix things). He wasn't on a lot of medication prior to his TAVR and he is trying to find a new "normal" since he didn't realize for a while that his heart was having issues before surgery. He has also noticed an increase in his tinitis which is very frustrating for him.His sleep pattern has continued to stay stable throughout recovery which he is thankful for.    Expected Outcomes Short: attend cardiac rehab for education and exercise. Long; develop positive self care habits.    Continue Psychosocial Services  Follow up required by staff             Psychosocial Re-Evaluation:  Psychosocial Re-Evaluation     Row Name 11/28/20 [phone] 01/02/21 0755 01/30/21 0801         Psychosocial Re-Evaluation    Current issues with None Identified None Identified None Identified     Comments Ronalee Belts is doing well in rehab.  He is feeling good mentally and sleeping well.  He denies any major stressors currently and is recovering well. Ronalee Belts is doing well in rehab.  He is feeling good mentally and sleeping well - he reports no problems except for the pollen bothering  him. Ronalee Belts is doing well in rehab. He reports no stressors right now except for the price of gas. He reports still sleeping well.     Expected Outcomes Short: Continue to exercise for mental boost Long: Continue to stay positive. Short: Continue to exercise for mental boost Long: Continue to stay positive. Short: Continue to exercise for mental boost Long: Continue to stay positive.     Interventions Encouraged to attend Cardiac Rehabilitation for the exercise Encouraged to attend Cardiac Rehabilitation for the exercise Encouraged to attend Cardiac Rehabilitation for the exercise     Continue Psychosocial Services  Follow up required by staff Follow up required by staff Follow up required by staff              Psychosocial Discharge (Final Psychosocial Re-Evaluation):  Psychosocial Re-Evaluation - 01/30/21 0801       Psychosocial Re-Evaluation   Current issues with None Identified    Comments Ronalee Belts is doing well in rehab. He reports no stressors right now except for the price of gas. He reports still sleeping well.    Expected Outcomes Short: Continue to exercise for mental boost Long: Continue to stay positive.    Interventions Encouraged to attend Cardiac Rehabilitation for the exercise    Continue Psychosocial Services  Follow up required by staff             Vocational Rehabilitation: Provide vocational rehab assistance to qualifying candidates.   Vocational Rehab Evaluation & Intervention:  Vocational Rehab - 11/13/20 1340       Initial Vocational Rehab Evaluation & Intervention   Assessment shows need for Vocational  Rehabilitation No             Education: Education Goals: Education classes will be provided on a variety of topics geared toward better understanding of heart health and risk factor modification. Participant will state understanding/return demonstration of topics presented as noted by education test scores.  Learning Barriers/Preferences:  Learning Barriers/Preferences - 11/13/20 1340       Learning Barriers/Preferences   Learning Barriers None    Learning Preferences None             General Cardiac Education Topics:  AED/CPR: - Group verbal and written instruction with the use of models to demonstrate the basic use of the AED with the basic ABC's of resuscitation.   Anatomy and Cardiac Procedures: - Group verbal and visual presentation and models provide information about basic cardiac anatomy and function. Reviews the testing methods done to diagnose heart disease and the outcomes of the test results. Describes the treatment choices: Medical Management, Angioplasty, or Coronary Bypass Surgery for treating various heart conditions including Myocardial Infarction, Angina, Valve Disease, and Cardiac Arrhythmias.  Written material given at graduation.   Medication Safety: - Group verbal and visual instruction to review commonly prescribed medications for heart and lung disease. Reviews the medication, class of the drug, and side effects. Includes the steps to properly store meds and maintain the prescription regimen.  Written material given at graduation. Flowsheet Row Cardiac Rehab from 01/25/2021 in [facility] Cardiac and Pulmonary Rehab  Date 01/11/21  Educator SB  Instruction Review Code 1- Verbalizes Understanding       Intimacy: - Group verbal instruction through game format to discuss how heart and lung disease can affect sexual intimacy. Written material given at graduation..   Know Your Numbers and Heart Failure: - Group verbal and visual instruction to discuss  disease risk factors for cardiac and pulmonary disease and  treatment options.  Reviews associated critical values for Overweight/Obesity, Hypertension, Cholesterol, and Diabetes.  Discusses basics of heart failure: signs/symptoms and treatments.  Introduces Heart Failure Zone chart for action plan for heart failure.  Written material given at graduation. Flowsheet Row Cardiac Rehab from 01/25/2021 in [facility] Cardiac and Pulmonary Rehab  Date 01/18/21  Educator SB  Instruction Review Code 1- Verbalizes Understanding       Infection Prevention: - Provides verbal and written material to individual with discussion of infection control including proper hand washing and proper equipment cleaning during exercise session. Flowsheet Row Cardiac Rehab from 01/25/2021 in [facility] Cardiac and Pulmonary Rehab  Education need identified 11/16/20  Date 11/16/20  Educator [place]  Instruction Review Code 1- Verbalizes Understanding       Falls Prevention: - Provides verbal and written material to individual with discussion of falls prevention and safety. Flowsheet Row Cardiac Rehab from 01/25/2021 in [facility] Cardiac and Pulmonary Rehab  Education need identified 11/16/20  Date 11/16/20  Educator [place]  Instruction Review Code 1- Verbalizes Understanding       Other: -Provides group and verbal instruction on various topics (see comments)   Knowledge Questionnaire Score:  Knowledge Questionnaire Score - 11/16/20 0933       Knowledge Questionnaire Score   Pre Score 26/26             Core Components/Risk Factors/Patient Goals at Admission:  Personal Goals and Risk Factors at Admission - 11/16/20 0951       Core Components/Risk Factors/Patient Goals on Admission    Weight Management Yes;Weight Loss    Intervention Weight Management: Develop a combined nutrition and exercise program designed to reach desired caloric intake, while maintaining appropriate intake of nutrient and fiber, sodium and fats, and  appropriate energy expenditure required for the weight goal.;Weight Management: Provide education and appropriate resources to help participant work on and attain dietary goals.;Weight Management/Obesity: Establish reasonable short term and long term weight goals.    Admit Weight 208 lb (94.3 kg)    Goal Weight: Short Term 203 lb (92.1 kg)    Goal Weight: Long Term 198 lb (89.8 kg)    Expected Outcomes Short Term: Continue to assess and modify interventions until short term weight is achieved;Long Term: Adherence to nutrition and physical activity/exercise program aimed toward attainment of established weight goal;Weight Loss: Understanding of general recommendations for a balanced deficit meal plan, which promotes 1-2 lb weight loss per week and includes a negative energy balance of [phone] kcal/d;Understanding recommendations for meals to include 15-35% energy as protein, 25-35% energy from fat, 35-60% energy from carbohydrates, less than 232m of dietary cholesterol, 20-35 gm of total fiber daily;Understanding of distribution of calorie intake throughout the day with the consumption of 4-5 meals/snacks    Hypertension Yes    Intervention Provide education on lifestyle modifcations including regular physical activity/exercise, weight management, moderate sodium restriction and increased consumption of fresh fruit, vegetables, and low fat dairy, alcohol moderation, and smoking cessation.;Monitor prescription use compliance.    Expected Outcomes Short Term: Continued assessment and intervention until BP is < 140/954mHG in hypertensive participants. < 130/8057mG in hypertensive participants with diabetes, heart failure or chronic kidney disease.;Long Term: Maintenance of blood pressure at goal levels.    Lipids Yes    Intervention Provide education and support for participant on nutrition & aerobic/resistive exercise along with prescribed medications to achieve LDL <16m44mDL >40mg15m Expected Outcomes  Short Term: Participant states understanding of desired cholesterol  values and is compliant with medications prescribed. Participant is following exercise prescription and nutrition guidelines.;Long Term: Cholesterol controlled with medications as prescribed, with individualized exercise RX and with personalized nutrition plan. Value goals: LDL < 20m, HDL > 40 mg.             Education:Diabetes - Individual verbal and written instruction to review signs/symptoms of diabetes, desired ranges of glucose level fasting, after meals and with exercise. Acknowledge that pre and post exercise glucose checks will be done for 3 sessions at entry of program.   Core Components/Risk Factors/Patient Goals Review:   Goals and Risk Factor Review     Row Name 11/28/20 0408105/10/22 0800 01/30/21 0804         Core Components/Risk Factors/Patient Goals Review   Personal Goals Review Weight Management/Obesity;Hypertension;Lipids Weight Management/Obesity;Hypertension;Lipids Weight Management/Obesity;Hypertension;Lipids     Review MChestonis doing well in rehab.  His weight is holding steady. His blood pressures are doing well and he is checking them at home. He reports his weight is steady +/-1 or2 pounds. He continues to check his BP at home - typically 120s/60s or 70s. Rehab (today 134/66). He is still taking all of his medications as directed. He has a cardiologist appointment yesterday - they wanted to put him on a medication that would cause him to pay $400 per month, but rovostatin was giving him GI symptoms - so started a lower dose. He reports his weight is steady - he rpeorts over time gaining 6 pounds, but believes this is muscle. He continues to check his BP at home - typically 120s or 130s/60s or 70s. Rehab (today 130/68). He is still taking all of his medications as directed. He rpeorts tolerating medications well.     Expected Outcomes Short: Continue to work on weight loss Long: Continue to monitor  risk factors Short: Continue to attend rehab, monitor GI symptoms with new medication dose Long: Continue to monitor risk factors Short: Continue to attend rehab Long: Continue to monitor risk factors              Core Components/Risk Factors/Patient Goals at Discharge (Final Review):   Goals and Risk Factor Review - 01/30/21 0804       Core Components/Risk Factors/Patient Goals Review   Personal Goals Review Weight Management/Obesity;Hypertension;Lipids    Review He reports his weight is steady - he rpeorts over time gaining 6 pounds, but believes this is muscle. He continues to check his BP at home - typically 120s or 130s/60s or 70s. Rehab (today 130/68). He is still taking all of his medications as directed. He rpeorts tolerating medications well.    Expected Outcomes Short: Continue to attend rehab Long: Continue to monitor risk factors             ITP Comments:  ITP Comments     Row Name 11/13/20 1343 11/16/20 0914 11/21/20 0752 12/13/20 1012 01/10/21 0728   ITP Comments Initial telephone orientation completed. [facility] EP orientation scheduled for 3/24 at 8am. Completed 6MWT and gym orientation. Initial ITP created and sent for review to Dr. MEmily Filbert Medical Director. First full day of exercise!  Patient was oriented to gym and equipment including functions, settings, policies, and procedures.  Patient's individual exercise prescription and treatment plan were reviewed.  All starting workloads were established based on the results of the 6 minute walk test done at initial orientation visit.  The plan for exercise progression was also introduced and  progression will be customized based on patient's performance and goals. 30 Day review completed. Medical Director ITP review done, changes made as directed, and signed approval by Medical Director. 30 Day review completed. Medical Director ITP review done, changes made as directed, and signed approval by  Medical Director.    [place] Name 02/07/21 0821           ITP Comments 30 Day review completed. Medical Director ITP review done, changes made as directed, and signed approval by Medical Director.                Comments:

## 2021-02-08 ENCOUNTER — Encounter: Payer: Medicare Other | Admitting: *Deleted

## 2021-02-08 ENCOUNTER — Other Ambulatory Visit: Payer: Self-pay

## 2021-02-08 DIAGNOSIS — Z952 Presence of prosthetic heart valve: Secondary | ICD-10-CM

## 2021-02-08 NOTE — Progress Notes (Signed)
Daily Session Note  Patient Details  Name: Richard [name] MRN: [id number] Date of Birth: 1950-11-24 Referring Provider:   Flowsheet Row Cardiac Rehab from 11/16/2020 in [facility] Cardiac and Pulmonary Rehab  Referring Provider Neoma Laming MD       Encounter Date: 02/08/2021  Check In:  Session Check In - 02/08/21 0907       Check-In   Supervising physician immediately available to respond to emergencies See telemetry face sheet for immediately available ER MD    Location [facility]-Cardiac & Pulmonary Rehab    Staff Present Heath Lark, RN, BSN, CCRP;Amanda Sommer, BA, ACSM CEP, Exercise Physiologist;Kelly Amedeo Plenty, BS, ACSM CEP, Exercise Physiologist;Joseph Tessie Fass RCP,RRT,BSRT    Virtual Visit No    Medication changes reported     No    Fall or balance concerns reported    No    Warm-up and Cool-down Performed on first and last piece of equipment    Resistance Training Performed Yes    VAD Patient? No    PAD/SET Patient? No      Pain Assessment   Currently in Pain? No/denies              Social History   Tobacco Use  Smoking Status Never  Smokeless Tobacco Former   Types: Chew, Snuff    Goals Met:  Independence with exercise equipment Exercise tolerated well No report of cardiac concerns or symptoms  Goals Unmet:  Not Applicable  Comments: Pt able to follow exercise prescription today without complaint.  Will continue to monitor for progression.    Dr. Emily Filbert is Medical Director for [place].  Dr. Ottie Glazier is Medical Director for [facility] Pulmonary Rehabilitation.

## 2021-02-13 ENCOUNTER — Other Ambulatory Visit: Payer: Self-pay

## 2021-02-13 DIAGNOSIS — Z952 Presence of prosthetic heart valve: Secondary | ICD-10-CM | POA: Diagnosis not present

## 2021-02-13 NOTE — Progress Notes (Signed)
Daily Session Note  Patient Details  Name: Richard [name] MRN: [id number] Date of Birth: [birth year] Referring Provider:   Flowsheet Row Cardiac Rehab from 11/16/2020 in [facility] Cardiac and Pulmonary Rehab  Referring Provider Neoma Laming MD       Encounter Date: 02/13/2021  Check In:  Session Check In - 02/13/21 0753       Check-In   Supervising physician immediately available to respond to emergencies See telemetry face sheet for immediately available ER MD    Location [facility]-Cardiac & Pulmonary Rehab    Staff Present Birdie Sons, MPA, Elveria Rising, BA, ACSM CEP, Exercise Physiologist;Kara Eliezer Bottom, MS, ASCM CEP, Exercise Physiologist    Virtual Visit No    Medication changes reported     No    Fall or balance concerns reported    No    Warm-up and Cool-down Performed on first and last piece of equipment    Resistance Training Performed Yes    VAD Patient? No    PAD/SET Patient? No      Pain Assessment   Currently in Pain? No/denies                Social History   Tobacco Use  Smoking Status Never  Smokeless Tobacco Former   Types: Chew, Snuff    Goals Met:  Independence with exercise equipment Exercise tolerated well Personal goals reviewed No report of cardiac concerns or symptoms Strength training completed today  Goals Unmet:  Not Applicable  Comments: Pt able to follow exercise prescription today without complaint.  Will continue to monitor for progression.    Dr. Emily Filbert is Medical Director for [place].  Dr. Ottie Glazier is Medical Director for [facility] Pulmonary Rehabilitation.

## 2021-02-14 NOTE — Patient Instructions (Signed)
Discharge Patient Instructions  Patient Details  Name: Richard [name] MRN: [id number] Date of Birth: Dec 31, 1950 Referring Provider:  Dionisio David, MD   Reason for Discharge:  Patient reached a stable level of exercise. Patient independent in their exercise. Patient has met program and personal goals.  Smoking History:  Social History   Tobacco Use  Smoking Status Never  Smokeless Tobacco Former   Types: Chew, Snuff    Diagnosis:  S/P TAVR (transcatheter aortic valve replacement)  Initial Exercise Prescription:  Initial Exercise Prescription - 11/16/20 0900       Date of Initial Exercise RX and Referring Provider   Date 11/16/20    Referring Provider Richard Laming MD      Treadmill   MPH 2.7    Grade 0.5    Minutes 15    METs 3.25      Recumbant Bike   Level 3    RPM 60    Watts 25    Minutes 15    METs 3.2      NuStep   Level 3    SPM 80    Minutes 15    METs 3.2      T5 Nustep   Level 2    SPM 80    Minutes 15    METs 3.2      Prescription Details   Frequency (times per week) 2    Duration Progress to 30 minutes of continuous aerobic without signs/symptoms of physical distress      Intensity   THRR 40-80% of Max Heartrate 101-135    Ratings of Perceived Exertion 11-13    Perceived Dyspnea 0-4      Progression   Progression Continue to progress workloads to maintain intensity without signs/symptoms of physical distress.      Resistance Training   Training Prescription Yes    Weight 3 lb    Reps 10-15             Discharge Exercise Prescription (Final Exercise Prescription Changes):  Exercise Prescription Changes - 02/14/21 0900       Response to Exercise   Blood Pressure (Admit) 122/66    Blood Pressure (Exit) 118/68    Heart Rate (Admit) 74 bpm    Heart Rate (Exercise) 96 bpm    Heart Rate (Exit) 90 bpm    Rating of Perceived Exertion (Exercise) 14    Symptoms none    Duration Continue with 30 min of aerobic exercise  without signs/symptoms of physical distress.    Intensity THRR unchanged      Progression   Progression Continue to progress workloads to maintain intensity without signs/symptoms of physical distress.    Average METs 4.67      Resistance Training   Training Prescription Yes    Weight 6 lb    Reps 10-15      Interval Training   Interval Training No      Treadmill   MPH 3.5    Grade 2    Minutes 15    METs 4.65      Recumbant Bike   Level 9    Watts 64    Minutes 15    METs 4.46      REL-XR   Level 9    Minutes 15    METs 6.8      T5 Nustep   Level 7    Minutes 15    METs 3.2      Home Exercise Plan  Plans to continue exercise at Home (comment)   walking, staff videos   Frequency Add 3 additional days to program exercise sessions.    Initial Home Exercises Provided 11/28/20             Functional Capacity:  6 Minute Walk     Row Name 11/16/20 0938 02/08/21 0937       6 Minute Walk   Phase Initial --    Distance 1470 feet 1830 feet    Distance % Change -- 24.4 %    Walk Time 6 minutes 6 minutes    # of Rest Breaks 0 0    MPH 2.78 3.47    METS 3.26 4.28    RPE 11 12    Perceived Dyspnea  0 0    VO2 Peak 11.41 14.98    Symptoms No No    Resting HR 67 bpm 68 bpm    Resting BP 128/68 134/76    Resting Oxygen Saturation  98 % 98 %    Exercise Oxygen Saturation  during 6 min walk 98 % 97 %    Max Ex. HR 87 bpm 103 bpm    Max Ex. BP 152/68 188/78    2 Minute Post BP 134/68 --            Nutrition:  Nutrition Therapy & Goals - 11/28/20 0846       Nutrition Therapy   Diet Heart healthy, low Na    Drug/Food Interactions Statins/Certain Fruits    Protein (specify units) 75g    Fiber 30 grams    Whole Grain Foods 3 servings    Saturated Fats 12 max. grams    Fruits and Vegetables 8 servings/day    Sodium 1.5 grams      Personal Nutrition Goals   Nutrition Goal ST: calculate the amount of salt eaten day to day, having snack with  afternoon coffee with protein, fat, and fiber - fruit and nuts, 1/2 sandwich, etc.  LT: limit salt intake to < 1.5g/day    Comments Richard [name] is a 70 yo male admitted to rehab with aortic stenosis; other conditions include HTN and HLD. Breakfast: 7am Egg - with some butter with english muffin (sourdough) or whole wheat bread or white flour tortilla or oatmeal or great grains cereal with 2% milk dried cherries or pecans. usually the egg option. Coffee - splash of 2% milk and water. Lunch 1130: variety - leftovers from night before or cold cut sandwich (whole wheat bread most of the time - [place] or smoked ham) or soup - canned (heart healthy kind). Diet pepsi. Snack: afternoon coffee. Snack: banana or small piece of chocolate or sliced peaches 4pm due to feeling dizzy and irritable. Dinner 6-630pm: variety - pasta on sundays (1/3 pasta, protein, non-starchy vegetables), mondays leftover pasta, salads (3 servings of vegetables), potroast, chicken, salmon, potstickers, risotto full of vegetables, hamburger with oven roasted fries, out to dinner 1x/week on fridays. Diet coke with dinner. He reports eating many different kinds of vegetables like beets, greens, brussells sprouts and only disliking peas. Slow cooker, sautee, roasting, baking, seldom fried. Uses olive oil or butter. He feels that butter and salt are his weeknesses. Uses salt while cooking - pinch here and there - he does not feel he has overly salted food. Drinks during the day: water. He rpeorts eating a variety of nuts and beans as well. Discussed heart healthy eating.      Intervention Plan   Intervention Prescribe, educate  and counsel regarding individualized specific dietary modifications aiming towards targeted core components such as weight, hypertension, lipid management, diabetes, heart failure and other comorbidities.;Nutrition handout(s) given to patient.    Expected Outcomes Short Term Goal: Understand basic principles of dietary content,  such as calories, fat, sodium, cholesterol and nutrients.;Short Term Goal: A plan has been developed with personal nutrition goals set during dietitian appointment.;Long Term Goal: Adherence to prescribed nutrition plan.            Goals reviewed with patient; copy given to patient.

## 2021-02-15 ENCOUNTER — Other Ambulatory Visit: Payer: Self-pay

## 2021-02-15 DIAGNOSIS — Z952 Presence of prosthetic heart valve: Secondary | ICD-10-CM | POA: Diagnosis not present

## 2021-02-15 NOTE — Progress Notes (Signed)
Daily Session Note  Patient Details  Name: Richard [name] MRN: [id number] Date of Birth: [birth year] Referring Provider:   Flowsheet Row Cardiac Rehab from 11/16/2020 in [facility] Cardiac [facility] Rehab  Referring Provider Neoma Laming MD       Encounter Date: 02/15/2021  Check In:  Session Check In - 02/15/21 0751       Check-In   Supervising physician immediately available to respond to emergencies See telemetry face sheet for immediately available ER MD    Location [facility]-Cardiac & Pulmonary Rehab    Staff Present Birdie Sons, MPA, Mauricia Area, BS, ACSM CEP, Exercise Physiologist;Amanda Oletta Darter, BA, ACSM CEP, Exercise Physiologist    Virtual Visit No    Medication changes reported     No    Fall or balance concerns reported    No    Warm-up and Cool-down Performed on first and last piece of equipment    Resistance Training Performed Yes    VAD Patient? No    PAD/SET Patient? No      Pain Assessment   Currently in Pain? No/denies                Social History   Tobacco Use  Smoking Status Never  Smokeless Tobacco Former   Types: Chew, Snuff    Goals Met:  Independence with exercise equipment Exercise tolerated well No report of cardiac concerns or symptoms Strength training completed today  Goals Unmet:  Not Applicable  Comments: Pt able to follow exercise prescription today without complaint.  Will continue to monitor for progression.    Dr. Emily Filbert is Medical Director for [place].  Dr. Ottie Glazier is Medical Director for [facility] Pulmonary Rehabilitation.

## 2021-02-20 ENCOUNTER — Other Ambulatory Visit: Payer: Self-pay

## 2021-02-20 DIAGNOSIS — Z952 Presence of prosthetic heart valve: Secondary | ICD-10-CM | POA: Diagnosis not present

## 2021-02-20 NOTE — Progress Notes (Signed)
Daily Session Note  Patient Details  Name: Richard [name] MRN: [id number] Date of Birth: 20-Oct-1950 Referring Provider:   Flowsheet Row Cardiac Rehab from 11/16/2020 in [facility] Cardiac and Pulmonary Rehab  Referring Provider Neoma Laming MD       Encounter Date: 02/20/2021  Check In:  Session Check In - 02/20/21 0743       Check-In   Supervising physician immediately available to respond to emergencies See telemetry face sheet for immediately available ER MD    Location [facility]-Cardiac & Pulmonary Rehab    Staff Present Birdie Sons, MPA, Elveria Rising, BA, ACSM CEP, Exercise Physiologist;Kara Eliezer Bottom, MS, ASCM CEP, Exercise Physiologist    Virtual Visit No    Medication changes reported     No    Fall or balance concerns reported    No    Warm-up and Cool-down Performed on first and last piece of equipment    Resistance Training Performed Yes    VAD Patient? No    PAD/SET Patient? No      Pain Assessment   Currently in Pain? No/denies                Social History   Tobacco Use  Smoking Status Never  Smokeless Tobacco Former   Types: Chew, Snuff    Goals Met:  Independence with exercise equipment Exercise tolerated well No report of cardiac concerns or symptoms Strength training completed today  Goals Unmet:  Not Applicable  Comments: Pt able to follow exercise prescription today without complaint.  Will continue to monitor for progression.    Dr. Emily Filbert is Medical Director for [place].  Dr. Ottie Glazier is Medical Director for [facility] Pulmonary Rehabilitation.

## 2021-02-22 ENCOUNTER — Other Ambulatory Visit: Payer: Self-pay

## 2021-02-22 DIAGNOSIS — Z952 Presence of prosthetic heart valve: Secondary | ICD-10-CM

## 2021-02-22 NOTE — Progress Notes (Signed)
Daily Session Note  Patient Details  Name: HALDEN PHEGLEY MRN: [id number] Date of Birth: [birth year] Referring Provider:   Flowsheet Row Cardiac Rehab from 11/16/2020 in [facility] Cardiac and Pulmonary Rehab  Referring Provider Neoma Laming MD       Encounter Date: 02/22/2021  Check In:  Session Check In - 02/22/21 0737       Check-In   Supervising physician immediately available to respond to emergencies See telemetry face sheet for immediately available ER MD    Location [facility]-Cardiac & Pulmonary Rehab    Staff Present Birdie Sons, MPA, Mauricia Area, BS, ACSM CEP, Exercise Physiologist;Amanda Oletta Darter, BA, ACSM CEP, Exercise Physiologist    Virtual Visit No    Medication changes reported     No    Fall or balance concerns reported    No    Warm-up and Cool-down Performed on first and last piece of equipment    Resistance Training Performed Yes    VAD Patient? No    PAD/SET Patient? No      Pain Assessment   Currently in Pain? No/denies                Social History   Tobacco Use  Smoking Status Never  Smokeless Tobacco Former   Types: Chew, Snuff    Goals Met:  Independence with exercise equipment Exercise tolerated well No report of cardiac concerns or symptoms Strength training completed today  Goals Unmet:  Not Applicable  Comments:  [place] graduated today from  rehab with 36 sessions completed.  Details of the patient's exercise prescription and what He needs to do in order to continue the prescription and progress were discussed with patient.  Patient was given a copy of prescription and goals.  Patient verbalized understanding.  [place] plans to continue to exercise by walking at home.    Dr. Emily Filbert is Medical Director for [place].  Dr. Ottie Glazier is Medical Director for [facility] Pulmonary Rehabilitation.

## 2021-02-22 NOTE — Progress Notes (Signed)
Cardiac Individual Treatment Plan  Patient Details  Name: Richard [name] MRN: [id number] Date of Birth: 03/24/1951 Referring Provider:   Flowsheet Row Cardiac Rehab from 11/16/2020 in [facility] Cardiac and Pulmonary Rehab  Referring Provider Neoma Laming MD       Initial Encounter Date:  Flowsheet Row Cardiac Rehab from 11/16/2020 in [facility] Cardiac and Pulmonary Rehab  Date 11/16/20       Visit Diagnosis: S/P TAVR (transcatheter aortic valve replacement)  Patient's Home Medications on Admission:  Current Outpatient Medications:    amLODipine (NORVASC) 10 MG tablet, Take 1 tablet by mouth daily., Disp: , Rfl:    amLODipine (NORVASC) 5 MG tablet, Take 1 tablet (5 mg total) by mouth daily. (Patient not taking: Reported on 11/13/2020), Disp: 30 tablet, Rfl: 2   amoxicillin (AMOXIL) 500 MG capsule, Take by mouth., Disp: , Rfl:    aspirin 81 MG chewable tablet, Chew by mouth., Disp: , Rfl:    Cholecalciferol (VITAMIN D3) 50 MCG (2000 UT) TABS, Take 2,000 Units by mouth 3 (three) times a week., Disp: , Rfl:    Coenzyme Q10 (CO Q 10) 100 MG CAPS, Take 100 mg by mouth daily., Disp: , Rfl:    losartan (COZAAR) 50 MG tablet, Take 1 tablet by mouth daily., Disp: , Rfl:    losartan-hydrochlorothiazide (HYZAAR) 50-12.5 MG tablet, Take 1 tablet by mouth daily. (Patient not taking: Reported on 11/13/2020), Disp: , Rfl:    rosuvastatin (CRESTOR) 20 MG tablet, Take 20 mg by mouth at bedtime., Disp: , Rfl:  No current facility-administered medications for this visit.  Facility-Administered Medications Ordered in Other Visits:    sodium chloride flush (NS) 0.9 % injection 3 mL, 3 mL, Intravenous, Q12H, Dionisio David, MD  Past Medical History: Past Medical History:  Diagnosis Date   Aortic stenosis    Hyperlipidemia    Hypertension    Mitral valve regurgitation     Tobacco Use: Social History   Tobacco Use  Smoking Status Never  Smokeless Tobacco Former   Types: Chew, Snuff     Labs: Recent Review Flowsheet Data     Labs for ITP Cardiac and Pulmonary Rehab Latest Ref Rng & Units 05/14/2020   Cholestrol 0 - 200 mg/dL 329(H)   LDLCALC 0 - 99 mg/dL 251(H)   HDL >40 mg/dL 41   Trlycerides <150 mg/dL 186(H)        Exercise Target Goals: Exercise Program Goal: Individual exercise prescription set using results from initial 6 min walk test and THRR while considering  patient's activity barriers and safety.   Exercise Prescription Goal: Initial exercise prescription builds to 30-45 minutes a day of aerobic activity, 2-3 days per week.  Home exercise guidelines will be given to patient during program as part of exercise prescription that the participant will acknowledge.   Education: Aerobic Exercise: - Group verbal and visual presentation on the components of exercise prescription. Introduces F.I.T.T principle from ACSM for exercise prescriptions.  Reviews F.I.T.T. principles of aerobic exercise including progression. Written material given at graduation.   Education: Resistance Exercise: - Group verbal and visual presentation on the components of exercise prescription. Introduces F.I.T.T principle from ACSM for exercise prescriptions  Reviews F.I.T.T. principles of resistance exercise including progression. Written material given at graduation.    Education: Exercise & Equipment Safety: - Individual verbal instruction and demonstration of equipment use and safety with use of the equipment. Flowsheet Row Cardiac Rehab from 02/08/2021 in [facility] Cardiac and Pulmonary Rehab  Education need identified  11/16/20  Date 11/16/20  Educator KL  Instruction Review Code 1- Verbalizes Understanding       Education: Exercise Physiology & General Exercise Guidelines: - Group verbal and written instruction with models to review the exercise physiology of the cardiovascular system and associated critical values. Provides general exercise guidelines with specific  guidelines to those with heart or lung disease.  Flowsheet Row Cardiac Rehab from 02/08/2021 in [facility] Cardiac and Pulmonary Rehab  Date 02/08/21  Educator AS  Instruction Review Code 1- Verbalizes Understanding       Education: Flexibility, Balance, Mind/Body Relaxation: - Group verbal and visual presentation with interactive activity on the components of exercise prescription. Introduces F.I.T.T principle from ACSM for exercise prescriptions. Reviews F.I.T.T. principles of flexibility and balance exercise training including progression. Also discusses the mind body connection.  Reviews various relaxation techniques to help reduce and manage stress (i.e. Deep breathing, progressive muscle relaxation, and visualization). Balance handout provided to take home. Written material given at graduation. Flowsheet Row Cardiac Rehab from 02/08/2021 in [facility] Cardiac and Pulmonary Rehab  Date 12/28/20  Educator AS  Instruction Review Code 1- Verbalizes Understanding       Activity Barriers & Risk Stratification:  Activity Barriers & Cardiac Risk Stratification - 11/16/20 0941       Activity Barriers & Cardiac Risk Stratification   Activity Barriers Deconditioning;Muscular Weakness    Cardiac Risk Stratification Moderate             6 Minute Walk:  6 Minute Walk     Row Name 11/16/20 0938 02/08/21 0937       6 Minute Walk   Phase Initial --    Distance 1470 feet 1830 feet    Distance % Change -- 24.4 %    Walk Time 6 minutes 6 minutes    # of Rest Breaks 0 0    MPH 2.78 3.47    METS 3.26 4.28    RPE 11 12    Perceived Dyspnea  0 0    VO2 Peak 11.41 14.98    Symptoms No No    Resting HR 67 bpm 68 bpm    Resting BP 128/68 134/76    Resting Oxygen Saturation  98 % 98 %    Exercise Oxygen Saturation  during 6 min walk 98 % 97 %    Max Ex. HR 87 bpm 103 bpm    Max Ex. BP 152/68 188/78    2 Minute Post BP 134/68 --             Oxygen Initial Assessment:   Oxygen  Re-Evaluation:   Oxygen Discharge (Final Oxygen Re-Evaluation):   Initial Exercise Prescription:  Initial Exercise Prescription - 11/16/20 0900       Date of Initial Exercise RX and Referring Provider   Date 11/16/20    Referring Provider Neoma Laming MD      Treadmill   MPH 2.7    Grade 0.5    Minutes 15    METs 3.25      Recumbant Bike   Level 3    RPM 60    Watts 25    Minutes 15    METs 3.2      NuStep   Level 3    SPM 80    Minutes 15    METs 3.2      T5 Nustep   Level 2    SPM 80    Minutes 15    METs 3.2  Prescription Details   Frequency (times per week) 2    Duration Progress to 30 minutes of continuous aerobic without signs/symptoms of physical distress      Intensity   THRR 40-80% of Max Heartrate 101-135    Ratings of Perceived Exertion 11-13    Perceived Dyspnea 0-4      Progression   Progression Continue to progress workloads to maintain intensity without signs/symptoms of physical distress.      Resistance Training   Training Prescription Yes    Weight 3 lb    Reps 10-15             Perform Capillary Blood Glucose checks as needed.  Exercise Prescription Changes:   Exercise Prescription Changes     Row Name 11/16/20 0900 11/21/20 1600 11/28/20 0800 12/05/20 1500 12/21/20 1400     Response to Exercise   Blood Pressure (Admit) 128/68 142/70 -- 138/66 124/68   Blood Pressure (Exercise) 152/68 142/64 -- 144/68 164/74   Blood Pressure (Exit) 134/68 126/68 -- 124/68 124/66   Heart Rate (Admit) 67 bpm 80 bpm -- 77 bpm 66 bpm   Heart Rate (Exercise) 87 bpm 102 bpm -- 105 bpm 102 bpm   Heart Rate (Exit) 73 bpm 85 bpm -- 84 bpm 72 bpm   Oxygen Saturation (Admit) 98 % -- -- -- --   Oxygen Saturation (Exercise) 98 % -- -- -- --   Oxygen Saturation (Exit) 98 % -- -- -- --   Rating of Perceived Exertion (Exercise) 11 13 -- 13 13   Perceived Dyspnea (Exercise) 0 -- -- -- --   Symptoms none none -- none none   Comments walk test  results first full day of exercise -- -- --   Duration -- Progress to 30 minutes of  aerobic without signs/symptoms of physical distress -- Continue with 30 min of aerobic exercise without signs/symptoms of physical distress. Continue with 30 min of aerobic exercise without signs/symptoms of physical distress.   Intensity -- THRR unchanged -- THRR unchanged THRR unchanged     Progression   Progression -- Continue to progress workloads to maintain intensity without signs/symptoms of physical distress. -- Continue to progress workloads to maintain intensity without signs/symptoms of physical distress. Continue to progress workloads to maintain intensity without signs/symptoms of physical distress.   Average METs -- 2.63 -- 2.91 3.46     Resistance Training   Training Prescription -- Yes -- Yes Yes   Weight -- 3 lb -- 3 lb 5 lb   Reps -- 10-15 -- 10-15 10-15     Interval Training   Interval Training -- No -- No No     Treadmill   MPH -- 2.7 -- 2.9 3.5   Grade -- 0.5 -- 0.5 0.5   Minutes -- 15 -- 15 15   METs -- 3.25 -- 3.42 3.92     Recumbant Bike   Level -- -- -- -- 7   Watts -- -- -- -- 48   Minutes -- -- -- -- 15   METs -- -- -- -- 6.75     NuStep   Level -- -- -- 4 --   SPM -- -- -- 80 --   Minutes -- -- -- 15 --   METs -- -- -- 2.4 --     T5 Nustep   Level -- 3 -- -- 6   Minutes -- 15 -- -- 15   METs -- 2 -- -- 2.9  Home Exercise Plan   Plans to continue exercise at -- -- Home (comment)  walking, staff videos Home (comment)  walking, staff videos Home (comment)  walking, staff videos   Frequency -- -- Add 3 additional days to program exercise sessions. Add 3 additional days to program exercise sessions. Add 3 additional days to program exercise sessions.   Initial Home Exercises Provided -- -- 11/28/20 11/28/20 11/28/20    Row Name 01/03/21 1400 01/18/21 0900 01/31/21 1600 02/14/21 0900       Response to Exercise   Blood Pressure (Admit) 134/66 118/70 130/68  122/66    Blood Pressure (Exercise) 182/68  recheck 154/68 206/76  154/72 162/68 --    Blood Pressure (Exit) 144/68 114/64 122/66 118/68    Heart Rate (Admit) 71 bpm 67 bpm 71 bpm 74 bpm    Heart Rate (Exercise) 96 bpm 104 bpm 106 bpm 96 bpm    Heart Rate (Exit) 93 bpm 86 bpm 86 bpm 90 bpm    Rating of Perceived Exertion (Exercise) _0 Symptoms none none none none    Duration Continue with 30 min of aerobic exercise without signs/symptoms of physical distress. Continue with 30 min of aerobic exercise without signs/symptoms of physical distress. Continue with 30 min of aerobic exercise without signs/symptoms of physical distress. Continue with 30 min of aerobic exercise without signs/symptoms of physical distress.    Intensity THRR unchanged THRR unchanged THRR unchanged THRR unchanged         Progression        Progression Continue to progress workloads to maintain intensity without signs/symptoms of physical distress. Continue to progress workloads to maintain intensity without signs/symptoms of physical distress. Continue to progress workloads to maintain intensity without signs/symptoms of physical distress. Continue to progress workloads to maintain intensity without signs/symptoms of physical distress.    Average METs 5.78 4.63 3.35 4.67         Resistance Training        Training Prescription Yes Yes Yes Yes    Weight 5 lb 6 lb 6 lb 6 lb    Reps 10-15 10-15 10-15 10-15         Interval Training        Interval Training No No No No         Treadmill        MPH 3.5 3.5 -- 3.5    Grade 1 1.5 -- 2    Minutes 15 15 -- 15    METs 4.16 4.4 -- 4.65         Recumbant Bike        Level -- _1 Watts -- 55 71 64    Minutes -- _2 METs -- 4.54 4.5 4.46         REL-XR        Level 9 8 -- 9    Minutes 15 15 -- 15    METs 7.4 6.2 -- 6.8         T5 Nustep        Level -- _3 Minutes -- _4 METs -- 3.6 2.8 3.2         Home Exercise Plan         Plans to continue exercise at -- Home (comment)  walking, staff videos Home (comment)  walking, staff videos Home (comment)  walking,  staff videos    Frequency -- Add 3 additional days to program exercise sessions. Add 3 additional days to program exercise sessions. Add 3 additional days to program exercise sessions.    Initial Home Exercises Provided -- 11/28/20 11/28/20 11/28/20            Exercise Comments:   Exercise Comments     Row Name 11/21/20 [phone]           Exercise Comments First full day of exercise!  Patient was oriented to gym and equipment including functions, settings, policies, and procedures.  Patient's individual exercise prescription and treatment plan were reviewed.  All starting workloads were established based on the results of the 6 minute walk test done at initial orientation visit.  The plan for exercise progression was also introduced and progression will be customized based on patient's performance and goals.                Exercise Goals and Review:   Exercise Goals     Row Name 11/16/20 0950             Exercise Goals   Increase Physical Activity Yes       Intervention Provide advice, education, support and counseling about physical activity/exercise needs.;Develop an individualized exercise prescription for aerobic and resistive training based on initial evaluation findings, risk stratification, comorbidities and participant's personal goals.       Expected Outcomes Short Term: Attend rehab on a regular basis to increase amount of physical activity.;Long Term: Add in home exercise to make exercise part of routine and to increase amount of physical activity.;Long Term: Exercising regularly at least 3-5 days a week.       Increase Strength and Stamina Yes       Intervention Provide advice, education, support and counseling about physical activity/exercise needs.;Develop an individualized exercise prescription for aerobic and resistive training  based on initial evaluation findings, risk stratification, comorbidities and participant's personal goals.       Expected Outcomes Short Term: Increase workloads from initial exercise prescription for resistance, speed, and METs.;Short Term: Perform resistance training exercises routinely during rehab and add in resistance training at home;Long Term: Improve cardiorespiratory fitness, muscular endurance and strength as measured by increased METs and functional capacity (6MWT)       Able to understand and use rate of perceived exertion (RPE) scale Yes       Intervention Provide education and explanation on how to use RPE scale       Expected Outcomes Short Term: Able to use RPE daily in rehab to express subjective intensity level;Long Term:  Able to use RPE to guide intensity level when exercising independently       Able to understand and use Dyspnea scale Yes       Intervention Provide education and explanation on how to use Dyspnea scale       Expected Outcomes Short Term: Able to use Dyspnea scale daily in rehab to express subjective sense of shortness of breath during exertion;Long Term: Able to use Dyspnea scale to guide intensity level when exercising independently       Knowledge and understanding of Target Heart Rate Range (THRR) Yes       Intervention Provide education and explanation of THRR including how the numbers were predicted and where they are located for reference       Expected Outcomes Short Term: Able to state/look up THRR;Short Term: Able to use daily as guideline for intensity in rehab;Long  Term: Able to use THRR to govern intensity when exercising independently       Able to check pulse independently Yes       Intervention Provide education and demonstration on how to check pulse in carotid and radial arteries.;Review the importance of being able to check your own pulse for safety during independent exercise       Expected Outcomes Short Term: Able to explain why pulse checking  is important during independent exercise;Long Term: Able to check pulse independently and accurately       Understanding of Exercise Prescription Yes       Intervention Provide education, explanation, and written materials on patient's individual exercise prescription       Expected Outcomes Short Term: Able to explain program exercise prescription;Long Term: Able to explain home exercise prescription to exercise independently                Exercise Goals Re-Evaluation :  Exercise Goals Re-Evaluation     Row Name 11/21/20 0752 11/28/20 0812 12/05/20 1518 12/21/20 1344 01/02/21 0804     Exercise Goal Re-Evaluation   Exercise Goals Review Increase Physical Activity;Able to understand and use rate of perceived exertion (RPE) scale;Knowledge and understanding of Target Heart Rate Range (THRR);Understanding of Exercise Prescription;Increase Strength and Stamina;Able to understand and use Dyspnea scale;Able to check pulse independently Increase Physical Activity;Increase Strength and Stamina;Understanding of Exercise Prescription;Able to understand and use rate of perceived exertion (RPE) scale;Able to understand and use Dyspnea scale;Knowledge and understanding of Target Heart Rate Range (THRR);Able to check pulse independently Increase Physical Activity;Increase Strength and Stamina Increase Physical Activity;Increase Strength and Stamina;Understanding of Exercise Prescription Increase Physical Activity;Increase Strength and Stamina;Understanding of Exercise Prescription   Comments Reviewed RPE and dyspnea scales, THR and program prescription with pt today.  Pt voiced understanding and was given a copy of goals to take home. Reviewed home exercise with pt today.  Pt plans to walk and use weights for exercise. We also talked about using the staff videos on bad weather days.  Reviewed THR, pulse, RPE, sign and symptoms, pulse oximetery and when to call 911 or MD.  Also discussed weather considerations  and indoor options.  Pt voiced understanding. Ronalee Belts is progressing well with exercise. He has increased levels on TM and seated machines.  Staff wil lmonitor progress. Ronalee Belts is doing well in rehab.  Ronalee Belts is doing level 7 on the bike and using 5 lb hand weights.  We will continue to monitor his progress. He walks at home on the days he is not here except for mondays and fridays - 2 miles with warm up and cool down lap as well as 6 laps of higher intensity walking (~40 minutes, RPE 13). just short of running. 8lb weights at home with the same days as aerobic exercise. He is also very active during the day.   Expected Outcomes Short: Use RPE daily to regulate intensity. Long: Follow program prescription in THR. Short: Start to add in exercise at home Long; Continue to follow program prescription Short: continue to attend consistently Long:  continue to build stamina Short: Continue to move up on workloads.  Long: Continue to build stamina Short: Continue to move up on workloads, continue to exercise at home  Long: Continue to build stamina    Row Name 01/03/21 1415 01/18/21 0902 01/30/21 0808 02/13/21 0748       Exercise Goal Re-Evaluation   Exercise Goals Review Increase Physical Activity;Increase Strength and Stamina Increase Physical Activity;Increase  Strength and Stamina;Understanding of Exercise Prescription Increase Physical Activity;Increase Strength and Stamina;Understanding of Exercise Prescription Increase Physical Activity;Increase Strength and Stamina;Understanding of Exercise Prescription    Comments Skeeter is progressing well and is up to level 9 on XR.  Staff will encourage trying 6 lb for strength work. Ronalee Belts is doing well in rehab.  He is up to level 6 on the NuStep.  We will continue to monitor his progress. He walks at home on the days he is not here except for mondays and fridays (however will sometimes walk these days too) - 2 miles with warm up and cool down lap as well as 6 laps of higher  intensity walking (~40 minutes, RPE 13). Just short of running. 8lb weights at home with the same days as aerobic exercise. He is also very active during the day. He walks at home on the days he is not here except for mondays and fridays (however will sometimes walk these days too) - 2 miles with warm up and cool down lap as well as 6 laps of higher intensity walking (~40 minutes, RPE 13). Just short of running. 8lb weights at home with the same days as aerobic exercise. No changes. He will also get back to riding his bike when not at rehab. He also continues to be very active during the day.    Expected Outcomes Short: move up to 6 lb for sterngth work Long:  increase overall MET level Short: Talk about intervals Long: Continue to improve stamina Short: Talk about intervals Long: Continue to improve stamina Short: graduate rehab Long: Continue to improve stamina             Discharge Exercise Prescription (Final Exercise Prescription Changes):  Exercise Prescription Changes - 02/14/21 0900       Response to Exercise   Blood Pressure (Admit) 122/66    Blood Pressure (Exit) 118/68    Heart Rate (Admit) 74 bpm    Heart Rate (Exercise) 96 bpm    Heart Rate (Exit) 90 bpm    Rating of Perceived Exertion (Exercise) 14    Symptoms none    Duration Continue with 30 min of aerobic exercise without signs/symptoms of physical distress.    Intensity THRR unchanged      Progression   Progression Continue to progress workloads to maintain intensity without signs/symptoms of physical distress.    Average METs 4.67      Resistance Training   Training Prescription Yes    Weight 6 lb    Reps 10-15      Interval Training   Interval Training No      Treadmill   MPH 3.5    Grade 2    Minutes 15    METs 4.65      Recumbant Bike   Level 9    Watts 64    Minutes 15    METs 4.46      REL-XR   Level 9    Minutes 15    METs 6.8      T5 Nustep   Level 7    Minutes 15    METs 3.2       Home Exercise Plan   Plans to continue exercise at Home (comment)   walking, staff videos   Frequency Add 3 additional days to program exercise sessions.    Initial Home Exercises Provided 11/28/20             Nutrition:  Target Goals: Understanding of nutrition guidelines,  daily intake of sodium <153m, cholesterol <207m calories 30% from fat and 7% or less from saturated fats, daily to have 5 or more servings of fruits and vegetables.  Education: All About Nutrition: -Group instruction provided by verbal, written material, interactive activities, discussions, models, and posters to present general guidelines for heart healthy nutrition including fat, fiber, MyPlate, the role of sodium in heart healthy nutrition, utilization of the nutrition label, and utilization of this knowledge for meal planning. Follow up email sent as well. Written material given at graduation.   Biometrics:  Pre Biometrics - 11/16/20 0940       Pre Biometrics   Height _0  (1.778 m)    Weight 208 lb 11.2 oz (94.7 kg)    BMI (Calculated) 29.95    Single Leg Stand 30 seconds              Nutrition Therapy Plan and Nutrition Goals:  Nutrition Therapy & Goals - 11/28/20 0846       Nutrition Therapy   Diet Heart healthy, low Na    Drug/Food Interactions Statins/Certain Fruits    Protein (specify units) 75g    Fiber 30 grams    Whole Grain Foods 3 servings    Saturated Fats 12 max. grams    Fruits and Vegetables 8 servings/day    Sodium 1.5 grams      Personal Nutrition Goals   Nutrition Goal ST: calculate the amount of salt eaten day to day, having snack with afternoon coffee with protein, fat, and fiber - fruit and nuts, 1/2 sandwich, etc.  LT: limit salt intake to < 1.5g/day    Comments MiDraylens a [age]o male admitted to rehab with aortic stenosis; other conditions include HTN and HLD. Breakfast: 7am Egg - with some butter with english muffin (sourdough) or whole wheat bread or white  flour tortilla or oatmeal or great grains cereal with 2% milk dried cherries or pecans. usually the egg option. Coffee - splash of 2% milk and water. Lunch 1130: variety - leftovers from night before or cold cut sandwich (whole wheat bread most of the time - tuKuwaitr smoked ham) or soup - canned (heart healthy kind). Diet pepsi. Snack: afternoon coffee. Snack: banana or small piece of chocolate or sliced peaches 4pm due to feeling dizzy and irritable. Dinner 6-630pm: variety - pasta on sundays (1/3 pasta, protein, non-starchy vegetables), mondays leftover pasta, salads (3 servings of vegetables), potroast, chicken, salmon, potstickers, risotto full of vegetables, hamburger with oven roasted fries, out to dinner 1x/week on fridays. Diet coke with dinner. He reports eating many different kinds of vegetables like beets, greens, brussells sprouts and only disliking peas. Slow cooker, sautee, roasting, baking, seldom fried. Uses olive oil or butter. He feels that butter and salt are his weeknesses. Uses salt while cooking - pinch here and there - he does not feel he has overly salted food. Drinks during the day: water. He rpeorts eating a variety of nuts and beans as well. Discussed heart healthy eating.      Intervention Plan   Intervention Prescribe, educate and counsel regarding individualized specific dietary modifications aiming towards targeted core components such as weight, hypertension, lipid management, diabetes, heart failure and other comorbidities.;Nutrition handout(s) given to patient.    Expected Outcomes Short Term Goal: Understand basic principles of dietary content, such as calories, fat, sodium, cholesterol and nutrients.;Short Term Goal: A plan has been developed with personal nutrition goals set during dietitian appointment.;Long Term Goal: Adherence to prescribed  nutrition plan.             Nutrition Assessments:  MEDIFICTS Score Key: ?70 Need to make dietary changes  40-70 Heart  Healthy Diet ? 40 Therapeutic Level Cholesterol Diet  Flowsheet Row Cardiac Rehab from 02/13/2021 in [facility] Cardiac and Pulmonary Rehab  Picture Your Plate Total Score on Admission 68  Picture Your Plate Total Score on Discharge 62      Picture Your Plate Scores: <38 Unhealthy dietary pattern with much room for improvement. 41-50 Dietary pattern unlikely to meet recommendations for good health and room for improvement. 51-60 More healthful dietary pattern, with some room for improvement.  >60 Healthy dietary pattern, although there may be some specific behaviors that could be improved.    Nutrition Goals Re-Evaluation:  Nutrition Goals Re-Evaluation     [place] Name 01/02/21 0758 01/30/21 0806 02/13/21 0750         Goals   Current Weight -- 214 lb 6.4 oz (97.3 kg) --     Nutrition Goal ST: continue working on reducing salt. LT: limit salt intake to < 1.5g/day ST: continue working on reducing salt. LT: limit salt intake to < 1.5g/day ST: continue working on reducing salt and maintain healhty eating post graduation LT: limit salt intake to < 1.5g/day     Comment He reports no changes other than limiting added salt with meals since we last spoke and he reports doing well and feels his diet is healthy and would not like to make any toher changes at this time. He reports still limiting salt, but enjoys the flavor - discussed some tips to help with flavor, reports he is aware of these techniques. He feels he is doing well with his diet and would not like to make any further changes at this time. He continues to do well with his diet and continues to manage his salt intake. He has no questions or concerns at this time. He forsees no barriers to continuing his diet.     Expected Outcome ST: continue working on reducing salt. LT: limit salt intake to < 1.5g/day ST: continue working on reducing salt. LT: limit salt intake to < 1.5g/day ST: continue working on reducing salt and maintain healhty eating post  graduation LT: limit salt intake to < 1.5g/day              Nutrition Goals Discharge (Final Nutrition Goals Re-Evaluation):  Nutrition Goals Re-Evaluation - 02/13/21 0750       Goals   Nutrition Goal ST: continue working on reducing salt and maintain healhty eating post graduation LT: limit salt intake to < 1.5g/day    Comment He continues to do well with his diet and continues to manage his salt intake. He has no questions or concerns at this time. He forsees no barriers to continuing his diet.    Expected Outcome ST: continue working on reducing salt and maintain healhty eating post graduation LT: limit salt intake to < 1.5g/day             Psychosocial: Target Goals: Acknowledge presence or absence of significant depression and/or stress, maximize coping skills, provide positive support system. Participant is able to verbalize types and ability to use techniques and skills needed for reducing stress and depression.   Education: Stress, Anxiety, and Depression - Group verbal and visual presentation to define topics covered.  Reviews how body is impacted by stress, anxiety, and depression.  Also discusses healthy ways to reduce stress and to treat/manage anxiety  and depression.  Written material given at graduation. Flowsheet Row Cardiac Rehab from 02/08/2021 in [facility] Cardiac and Pulmonary Rehab  Date 11/30/20  Educator [facility]  Instruction Review Code 1- [employer] Understanding       Education: Sleep Hygiene -Provides group verbal and written instruction about how sleep can affect your health.  Define sleep hygiene, discuss sleep cycles and impact of sleep habits. Review good sleep hygiene tips.    Initial Review & Psychosocial Screening:  Initial Psych Review & Screening - 11/13/20 1340       Initial Review   Current issues with Current Stress Concerns    Source of Stress Concerns Unable to participate in former interests or hobbies;Unable to perform yard/household  activities      Viola? Yes   wife     Barriers   Psychosocial barriers to participate in program There are no identifiable barriers or psychosocial needs.;The patient should benefit from training in stress management and relaxation.      Screening Interventions   Interventions Encouraged to exercise;Provide feedback about the scores to participant;To provide support and resources with identified psychosocial needs    Expected Outcomes Short Term goal: Utilizing psychosocial counselor, staff and physician to assist with identification of specific Stressors or current issues interfering with healing process. Setting desired goal for each stressor or current issue identified.;Long Term Goal: Stressors or current issues are controlled or eliminated.;Short Term goal: Identification and review with participant of any Quality of Life or Depression concerns found by scoring the questionnaire.;Long Term goal: The participant improves quality of Life and PHQ9 Scores as seen by post scores and/or verbalization of changes             Quality of Life Scores:   Quality of Life - 02/13/21 0755       Quality of Life Scores   Health/Function Pre 25.33 %    Health/Function Post 27.9 %    Health/Function % Change 10.15 %    Socioeconomic Pre 24.99 %    Socioeconomic Post 27.38 %    Socioeconomic % Change  9.56 %    Psych/Spiritual Pre 25.36 %    Psych/Spiritual Post 26.43 %    Psych/Spiritual % Change 4.22 %    Family Pre 29.5 %    Family Post 30 %    Family % Change 1.69 %    GLOBAL Pre 25.79 %    GLOBAL Post 27.79 %    GLOBAL % Change 7.75 %            Scores of 19 and below usually indicate a poorer quality of life in these areas.  A difference of  2-3 points is a clinically meaningful difference.  A difference of 2-3 points in the total score of the Quality of Life Index has been associated with significant improvement in overall quality of life,  self-image, physical symptoms, and general health in studies assessing change in quality of life.  PHQ-9: Recent Review Flowsheet Data     Depression screen [facility] 2/9 02/13/2021 11/16/2020   Decreased Interest 0 0   Down, Depressed, Hopeless 0 0   PHQ - 2 Score 0 0   Altered sleeping 0 0   Tired, decreased energy 0 1   Change in appetite 0 0   Feeling bad or failure about yourself  0 0   Trouble concentrating 0 0   Moving slowly or fidgety/restless 0 0   Suicidal thoughts 0 0  PHQ-9 Score 0 1   Difficult doing work/chores Not difficult at all Not difficult at all      Interpretation of Total Score  Total Score Depression Severity:  1-4 = Minimal depression, 5-9 = Mild depression, 10-14 = Moderate depression, 15-19 = Moderately severe depression, 20-27 = Severe depression   Psychosocial Evaluation and Intervention:  Psychosocial Evaluation - 11/13/20 1344       Psychosocial Evaluation & Interventions   Interventions Encouraged to exercise with the program and follow exercise prescription    Comments 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 They have adjusted medications which has helped, but he still has days where he feels dizzy. His MD is aware and they are hoping with time it will adjust itself.  He states this has caused stress since he can't do different activities around the house (ex: get on a ladder and fix things). He wasn't on a lot of medication prior to his TAVR and he is trying to find a new "normal" since he didn't realize for a while that his heart was having issues before surgery. He has also noticed an increase in his tinitis which is very frustrating for him.His sleep pattern has continued to stay stable throughout recovery which he is thankful for.    Expected Outcomes Short: attend cardiac rehab for education and exercise. Long; develop positive self care habits.    Continue Psychosocial Services  Follow up required  by staff             Psychosocial Re-Evaluation:  Psychosocial Re-Evaluation     Row Name 11/28/20 [phone] 01/02/21 0755 01/30/21 0801 02/13/21 0749       Psychosocial Re-Evaluation   Current issues with None Identified None Identified None Identified None Identified    Comments Ronalee Belts is doing well in rehab.  He is feeling good mentally and sleeping well.  He denies any major stressors currently and is recovering well. Ronalee Belts is doing well in rehab.  He is feeling good mentally and sleeping well - he reports no problems except for the pollen bothering him. Ronalee Belts is doing well in rehab. He reports no stressors right now except for the price of gas. He reports still sleeping well. Ronalee Belts is doing well in rehab. He reports still sleeping well and has no major stressors to report.    Expected Outcomes Short: Continue to exercise for mental boost Long: Continue to stay positive. Short: Continue to exercise for mental boost Long: Continue to stay positive. Short: Continue to exercise for mental boost Long: Continue to stay positive. Short: Continue to exercise for mental boost post graduation Long: Continue to stay positive.    Interventions Encouraged to attend Cardiac Rehabilitation for the exercise Encouraged to attend Cardiac Rehabilitation for the exercise Encouraged to attend Cardiac Rehabilitation for the exercise Encouraged to attend Cardiac Rehabilitation for the exercise    Continue Psychosocial Services  Follow up required by staff Follow up required by staff Follow up required by staff Follow up required by staff             Psychosocial Discharge (Final Psychosocial Re-Evaluation):  Psychosocial Re-Evaluation - 02/13/21 0749       Psychosocial Re-Evaluation   Current issues with None Identified    Comments Ronalee Belts is doing well in rehab. He reports still sleeping well and has no major stressors to report.    Expected Outcomes Short: Continue to exercise for mental boost post graduation  Long: Continue  to stay positive.    Interventions Encouraged to attend Cardiac Rehabilitation for the exercise    Continue Psychosocial Services  Follow up required by staff             Vocational Rehabilitation: Provide vocational rehab assistance to qualifying candidates.   Vocational Rehab Evaluation & Intervention:  Vocational Rehab - 11/13/20 1340       Initial Vocational Rehab Evaluation & Intervention   Assessment shows need for Vocational Rehabilitation No             Education: Education Goals: Education classes will be provided on a variety of topics geared toward better understanding of heart health and risk factor modification. Participant will state understanding/return demonstration of topics presented as noted by education test scores.  Learning Barriers/Preferences:  Learning Barriers/Preferences - 11/13/20 1340       Learning Barriers/Preferences   Learning Barriers None    Learning Preferences None             General Cardiac Education Topics:  AED/CPR: - Group verbal and written instruction with the use of models to demonstrate the basic use of the AED with the basic ABC's of resuscitation.   Anatomy and Cardiac Procedures: - Group verbal and visual presentation and models provide information about basic cardiac anatomy and function. Reviews the testing methods done to diagnose heart disease and the outcomes of the test results. Describes the treatment choices: Medical Management, Angioplasty, or Coronary Bypass Surgery for treating various heart conditions including Myocardial Infarction, Angina, Valve Disease, and Cardiac Arrhythmias.  Written material given at graduation.   Medication Safety: - Group verbal and visual instruction to review commonly prescribed medications for heart and lung disease. Reviews the medication, class of the drug, and side effects. Includes the steps to properly store meds and maintain the prescription regimen.   Written material given at graduation. Flowsheet Row Cardiac Rehab from 02/08/2021 in [facility] Cardiac and Pulmonary Rehab  Date 01/11/21  Educator SB  Instruction Review Code 1- Verbalizes Understanding       Intimacy: - Group verbal instruction through game format to discuss how heart and lung disease can affect sexual intimacy. Written material given at graduation..   Know Your Numbers and Heart Failure: - Group verbal and visual instruction to discuss disease risk factors for cardiac and pulmonary disease and treatment options.  Reviews associated critical values for Overweight/Obesity, Hypertension, Cholesterol, and Diabetes.  Discusses basics of heart failure: signs/symptoms and treatments.  Introduces Heart Failure Zone chart for action plan for heart failure.  Written material given at graduation. Flowsheet Row Cardiac Rehab from 02/08/2021 in [facility] Cardiac and Pulmonary Rehab  Date 01/18/21  Educator SB  Instruction Review Code 1- Verbalizes Understanding       Infection Prevention: - Provides verbal and written material to individual with discussion of infection control including proper hand washing and proper equipment cleaning during exercise session. Flowsheet Row Cardiac Rehab from 02/08/2021 in [facility] Cardiac and Pulmonary Rehab  Education need identified 11/16/20  Date 11/16/20  Educator [place]  Instruction Review Code 1- Verbalizes Understanding       Falls Prevention: - Provides verbal and written material to individual with discussion of falls prevention and safety. Flowsheet Row Cardiac Rehab from 02/08/2021 in [facility] Cardiac and Pulmonary Rehab  Education need identified 11/16/20  Date 11/16/20  Educator [place]  Instruction Review Code 1- Verbalizes Understanding       Other: -Provides group and verbal instruction on various topics (see comments)  Knowledge Questionnaire Score:  Knowledge Questionnaire Score - 02/13/21 0755       Knowledge Questionnaire Score    Pre Score 26/26    Post Score 26/26             Core Components/Risk Factors/Patient Goals at Admission:  Personal Goals and Risk Factors at Admission - 11/16/20 0951       Core Components/Risk Factors/Patient Goals on Admission    Weight Management Yes;Weight Loss    Intervention Weight Management: Develop a combined nutrition and exercise program designed to reach desired caloric intake, while maintaining appropriate intake of nutrient and fiber, sodium and fats, and appropriate energy expenditure required for the weight goal.;Weight Management: Provide education and appropriate resources to help participant work on and attain dietary goals.;Weight Management/Obesity: Establish reasonable short term and long term weight goals.    Admit Weight 208 lb (94.3 kg)    Goal Weight: Short Term 203 lb (92.1 kg)    Goal Weight: Long Term 198 lb (89.8 kg)    Expected Outcomes Short Term: Continue to assess and modify interventions until short term weight is achieved;Long Term: Adherence to nutrition and physical activity/exercise program aimed toward attainment of established weight goal;Weight Loss: Understanding of general recommendations for a balanced deficit meal plan, which promotes 1-2 lb weight loss per week and includes a negative energy balance of [phone] kcal/d;Understanding recommendations for meals to include 15-35% energy as protein, 25-35% energy from fat, 35-60% energy from carbohydrates, less than 237m of dietary cholesterol, 20-35 gm of total fiber daily;Understanding of distribution of calorie intake throughout the day with the consumption of 4-5 meals/snacks    Hypertension Yes    Intervention Provide education on lifestyle modifcations including regular physical activity/exercise, weight management, moderate sodium restriction and increased consumption of fresh fruit, vegetables, and low fat dairy, alcohol moderation, and smoking cessation.;Monitor prescription use compliance.     Expected Outcomes Short Term: Continued assessment and intervention until BP is < 140/910mHG in hypertensive participants. < 130/8024mG in hypertensive participants with diabetes, heart failure or chronic kidney disease.;Long Term: Maintenance of blood pressure at goal levels.    Lipids Yes    Intervention Provide education and support for participant on nutrition & aerobic/resistive exercise along with prescribed medications to achieve LDL <50m97mDL >40mg50m Expected Outcomes Short Term: Participant states understanding of desired cholesterol values and is compliant with medications prescribed. Participant is following exercise prescription and nutrition guidelines.;Long Term: Cholesterol controlled with medications as prescribed, with individualized exercise RX and with personalized nutrition plan. Value goals: LDL < 50mg,50m > 40 mg.             Education:Diabetes - Individual verbal and written instruction to review signs/symptoms of diabetes, desired ranges of glucose level fasting, after meals and with exercise. Acknowledge that pre and post exercise glucose checks will be done for 3 sessions at entry of program.   Core Components/Risk Factors/Patient Goals Review:   Goals and Risk Factor Review     Row Name 11/28/20 0807 09476/22 0800 01/30/21 0804 02/13/21 0751       Core Components/Risk Factors/Patient Goals Review   Personal Goals Review Weight Management/Obesity;Hypertension;Lipids Weight Management/Obesity;Hypertension;Lipids Weight Management/Obesity;Hypertension;Lipids Weight Management/Obesity;Hypertension;Lipids    Review MichaeUndreaing well in rehab.  His weight is holding steady. His blood pressures are doing well and he is checking them at home. He reports his weight is steady +/-1 or2 pounds. He continues to check his BP at home - typically  120s/60s or 70s. Rehab (today 134/66). He is still taking all of his medications as directed. He has a cardiologist appointment  yesterday - they wanted to put him on a medication that would cause him to pay $400 per month, but rovostatin was giving him GI symptoms - so started a lower dose. He reports his weight is steady - he rpeorts over time gaining 6 pounds, but believes this is muscle. He continues to check his BP at home - typically 120s or 130s/60s or 70s. Rehab (today 130/68). He is still taking all of his medications as directed. He rpeorts tolerating medications well. Weight is up - 8lbs since starting, but continues to do well with exercise and diet - feels this is muscle. He continues to check his BP at home- still 120-130s/70. Still taking medications as directed, having problems with GI distress from statin (now at lowest dose, plans on talking to his doctor).    Expected Outcomes Short: Continue to work on weight loss Long: Continue to monitor risk factors Short: Continue to attend rehab, monitor GI symptoms with new medication dose Long: Continue to monitor risk factors Short: Continue to attend rehab Long: Continue to monitor risk factors Short: graduate rehab, talk to doctor about medication Long: Continue to monitor risk factors             Core Components/Risk Factors/Patient Goals at Discharge (Final Review):   Goals and Risk Factor Review - 02/13/21 0751       Core Components/Risk Factors/Patient Goals Review   Personal Goals Review Weight Management/Obesity;Hypertension;Lipids    Review Weight is up - 8lbs since starting, but continues to do well with exercise and diet - feels this is muscle. He continues to check his BP at home- still 120-130s/70. Still taking medications as directed, having problems with GI distress from statin (now at lowest dose, plans on talking to his doctor).    Expected Outcomes Short: graduate rehab, talk to doctor about medication Long: Continue to monitor risk factors             ITP Comments:  ITP Comments     Row Name 11/13/20 1343 11/16/20 0914 11/21/20 0752  12/13/20 1012 01/10/21 0728   ITP Comments Initial telephone orientation completed. Diagnosis can be found in [facility] 12/7. EP orientation scheduled for 3/24 at 8am. Completed 6MWT and gym orientation. Initial ITP created and sent for review to Dr. Emily Filbert, Medical Director. First full day of exercise!  Patient was oriented to gym and equipment including functions, settings, policies, and procedures.  Patient's individual exercise prescription and treatment plan were reviewed.  All starting workloads were established based on the results of the 6 minute walk test done at initial orientation visit.  The plan for exercise progression was also introduced and progression will be customized based on patient's performance and goals. 30 Day review completed. Medical Director ITP review done, changes made as directed, and signed approval by Medical Director. 30 Day review completed. Medical Director ITP review done, changes made as directed, and signed approval by Medical Director.    [place] Name 02/07/21 0821 02/22/21 0738         ITP Comments 30 Day review completed. Medical Director ITP review done, changes made as directed, and signed approval by Medical Director. Maliki graduated today from  rehab with 36 sessions completed.  Details of the patient's exercise prescription and what He needs to do in order to continue the prescription and progress were discussed with patient.  Patient was given a copy of prescription and goals.  Patient verbalized understanding.  Arrie plans to continue to exercise by walking at home.               Comments: discharge ITP

## 2021-02-22 NOTE — Progress Notes (Signed)
Discharge Progress Report  Patient Details  Name: Richard [name] MRN: [id number] Date of Birth: Mar 04, 1951 Referring Provider:   Flowsheet Row Cardiac Rehab from 11/16/2020 in [facility]  Referring Provider Neoma Laming MD        Number of Visits: 36  Reason for Discharge:  Patient reached a stable level of exercise. Patient independent in their exercise. Patient has met program and personal goals.  Smoking History:  Social History   Tobacco Use  Smoking Status Never  Smokeless Tobacco Former   Types: Chew, Snuff    Diagnosis:  S/P TAVR (transcatheter aortic valve replacement)  ADL UCSD:   Initial Exercise Prescription:  Initial Exercise Prescription - 11/16/20 0900       Date of Initial Exercise RX and Referring Provider   Date 11/16/20    Referring Provider Neoma Laming MD      Treadmill   MPH 2.7    Grade 0.5    Minutes 15    METs 3.25      Recumbant Bike   Level 3    RPM 60    Watts 25    Minutes 15    METs 3.2      NuStep   Level 3    SPM 80    Minutes 15    METs 3.2      T5 Nustep   Level 2    SPM 80    Minutes 15    METs 3.2      Prescription Details   Frequency (times per week) 2    Duration Progress to 30 minutes of continuous aerobic without signs/symptoms of physical distress      Intensity   THRR 40-80% of Max Heartrate 101-135    Ratings of Perceived Exertion 11-13    Perceived Dyspnea 0-4      Progression   Progression Continue to progress workloads to maintain intensity without signs/symptoms of physical distress.      Resistance Training   Training Prescription Yes    Weight 3 lb    Reps 10-15             Discharge Exercise Prescription (Final Exercise Prescription Changes):  Exercise Prescription Changes - 02/14/21 0900       Response to Exercise   Blood Pressure (Admit) 122/66    Blood Pressure (Exit) 118/68    Heart Rate (Admit) 74 bpm    Heart Rate (Exercise) 96 bpm     Heart Rate (Exit) 90 bpm    Rating of Perceived Exertion (Exercise) 14    Symptoms none    Duration Continue with 30 min of aerobic exercise without signs/symptoms of physical distress.    Intensity THRR unchanged      Progression   Progression Continue to progress workloads to maintain intensity without signs/symptoms of physical distress.    Average METs 4.67      Resistance Training   Training Prescription Yes    Weight 6 lb    Reps 10-15      Interval Training   Interval Training No      Treadmill   MPH 3.5    Grade 2    Minutes 15    METs 4.65      Recumbant Bike   Level 9    Watts 64    Minutes 15    METs 4.46      REL-XR   Level 9    Minutes 15    METs 6.8  T5 Nustep   Level 7    Minutes 15    METs 3.2      Home Exercise Plan   Plans to continue exercise at Home (comment)   walking, staff videos   Frequency Add 3 additional days to program exercise sessions.    Initial Home Exercises Provided 11/28/20             Functional Capacity:  6 Minute Walk     Row Name 11/16/20 0938 02/08/21 0937       6 Minute Walk   Phase Initial --    Distance 1470 feet 1830 feet    Distance % Change -- 24.4 %    Walk Time 6 minutes 6 minutes    # of Rest Breaks 0 0    MPH 2.78 3.47    METS 3.26 4.28    RPE 11 12    Perceived Dyspnea  0 0    VO2 Peak 11.41 14.98    Symptoms No No    Resting HR 67 bpm 68 bpm    Resting BP 128/68 134/76    Resting Oxygen Saturation  98 % 98 %    Exercise Oxygen Saturation  during 6 min walk 98 % 97 %    Max Ex. HR 87 bpm 103 bpm    Max Ex. BP 152/68 188/78    2 Minute Post BP 134/68 --             Psychological, QOL, Others - Outcomes: PHQ 2/9: Depression screen [facility] 2/9 02/13/2021 11/16/2020  Decreased Interest 0 0  Down, Depressed, Hopeless 0 0  PHQ - 2 Score 0 0  Altered sleeping 0 0  Tired, decreased energy 0 1  Change in appetite 0 0  Feeling bad or failure about yourself  0 0  Trouble concentrating 0  0  Moving slowly or fidgety/restless 0 0  Suicidal thoughts 0 0  PHQ-9 Score 0 1  Difficult doing work/chores Not difficult at all Not difficult at all    Quality of Life:  Quality of Life - 02/13/21 0755       Quality of Life Scores   Health/Function Pre 25.33 %    Health/Function Post 27.9 %    Health/Function % Change 10.15 %    Socioeconomic Pre 24.99 %    Socioeconomic Post 27.38 %    Socioeconomic % Change  9.56 %    Psych/Spiritual Pre 25.36 %    Psych/Spiritual Post 26.43 %    Psych/Spiritual % Change 4.22 %    Family Pre 29.5 %    Family Post 30 %    Family % Change 1.69 %    GLOBAL Pre 25.79 %    GLOBAL Post 27.79 %    GLOBAL % Change 7.75 %             Nutrition & Weight - Outcomes:  Pre Biometrics - 11/16/20 0940       Pre Biometrics   Height $Remov'5\' 10"'hWdtwC$  (1.778 m)    Weight 208 lb 11.2 oz (94.7 kg)    BMI (Calculated) 29.95    Single Leg Stand 30 seconds              Nutrition:  Nutrition Therapy & Goals - 11/28/20 0846       Nutrition Therapy   Diet Heart healthy, low Na    Drug/Food Interactions Statins/Certain Fruits    Protein (specify units) 75g    Fiber 30 grams  Whole Grain Foods 3 servings    Saturated Fats 12 max. grams    Fruits and Vegetables 8 servings/day    Sodium 1.5 grams      Personal Nutrition Goals   Nutrition Goal ST: calculate the amount of salt eaten day to day, having snack with afternoon coffee with protein, fat, and fiber - fruit and nuts, 1/2 sandwich, etc.  LT: limit salt intake to < 1.5g/day    Comments Richard [name] is a 70 yo male admitted to rehab with aortic stenosis; other conditions include HTN and HLD. Breakfast: 7am Egg - with some butter with english muffin (sourdough) or whole wheat bread or white flour tortilla or oatmeal or great grains cereal with 2% milk dried cherries or pecans. usually the egg option. Coffee - splash of 2% milk and water. Lunch 1130: variety - leftovers from night before or cold cut  sandwich (whole wheat bread most of the time - [place] or smoked ham) or soup - canned (heart healthy kind). Diet pepsi. Snack: afternoon coffee. Snack: banana or small piece of chocolate or sliced peaches 4pm due to feeling dizzy and irritable. Dinner 6-630pm: variety - pasta on sundays (1/3 pasta, protein, non-starchy vegetables), mondays leftover pasta, salads (3 servings of vegetables), potroast, chicken, salmon, potstickers, risotto full of vegetables, hamburger with oven roasted fries, out to dinner 1x/week on fridays. Diet coke with dinner. He reports eating many different kinds of vegetables like beets, greens, brussells sprouts and only disliking peas. Slow cooker, sautee, roasting, baking, seldom fried. Uses olive oil or butter. He feels that butter and salt are his weeknesses. Uses salt while cooking - pinch here and there - he does not feel he has overly salted food. Drinks during the day: water. He rpeorts eating a variety of nuts and beans as well. Discussed heart healthy eating.      Intervention Plan   Intervention Prescribe, educate and counsel regarding individualized specific dietary modifications aiming towards targeted core components such as weight, hypertension, lipid management, diabetes, heart failure and other comorbidities.;Nutrition handout(s) given to patient.    Expected Outcomes Short Term Goal: Understand basic principles of dietary content, such as calories, fat, sodium, cholesterol and nutrients.;Short Term Goal: A plan has been developed with personal nutrition goals set during dietitian appointment.;Long Term Goal: Adherence to prescribed nutrition plan.             Nutrition Discharge:   Education Questionnaire Score:  Knowledge Questionnaire Score - 02/13/21 0755       Knowledge Questionnaire Score   Pre Score 26/26    Post Score 26/26             Goals reviewed with patient; copy given to patient.

## 2022-04-22 DIAGNOSIS — Z952 Presence of prosthetic heart valve: Secondary | ICD-10-CM | POA: Insufficient documentation

## 2022-10-18 ENCOUNTER — Other Ambulatory Visit: Payer: Self-pay | Admitting: Cardiovascular Disease

## 2022-10-18 DIAGNOSIS — I10 Essential (primary) hypertension: Secondary | ICD-10-CM

## 2022-10-18 DIAGNOSIS — E782 Mixed hyperlipidemia: Secondary | ICD-10-CM

## 2022-11-18 ENCOUNTER — Ambulatory Visit: Payer: Medicare Other | Admitting: Cardiovascular Disease

## 2022-11-25 ENCOUNTER — Other Ambulatory Visit: Payer: Self-pay | Admitting: Cardiovascular Disease

## 2022-11-25 ENCOUNTER — Other Ambulatory Visit: Payer: Medicare Other

## 2022-11-26 LAB — COMPREHENSIVE METABOLIC PANEL
ALT: 27 IU/L (ref 0–44)
AST: 25 IU/L (ref 0–40)
Albumin/Globulin Ratio: 1.5 (ref 1.2–2.2)
Albumin: 4.3 g/dL (ref 3.9–4.9)
Alkaline Phosphatase: 69 IU/L (ref 44–121)
BUN/Creatinine Ratio: 11 (ref 10–24)
BUN: 12 mg/dL (ref 8–27)
Bilirubin Total: 0.5 mg/dL (ref 0.0–1.2)
CO2: 22 mmol/L (ref 20–29)
Calcium: 9.3 mg/dL (ref 8.6–10.2)
Chloride: 105 mmol/L (ref 96–106)
Creatinine, Ser: 1.11 mg/dL (ref 0.76–1.27)
Globulin, Total: 2.8 g/dL (ref 1.5–4.5)
Glucose: 99 mg/dL (ref 70–99)
Potassium: 4.5 mmol/L (ref 3.5–5.2)
Sodium: 140 mmol/L (ref 134–144)
Total Protein: 7.1 g/dL (ref 6.0–8.5)
eGFR: 72 mL/min/{1.73_m2} (ref >59)

## 2022-11-26 LAB — LIPID PANEL W/O CHOL/HDL RATIO
Cholesterol, Total: 191 mg/dL (ref 100–199)
HDL: 39 mg/dL — ABNORMAL LOW (ref >39)
LDL Chol Calc (NIH): 117 mg/dL — ABNORMAL HIGH (ref 0–99)
Triglycerides: 197 mg/dL — ABNORMAL HIGH (ref 0–149)
VLDL Cholesterol Cal: 35 mg/dL (ref 5–40)

## 2022-12-02 ENCOUNTER — Ambulatory Visit: Payer: Medicare Other | Admitting: Cardiovascular Disease

## 2022-12-02 ENCOUNTER — Encounter: Payer: Self-pay | Admitting: Cardiovascular Disease

## 2022-12-02 VITALS — BP 132/78 | HR 75 | Ht 69.0 in | Wt 226.6 lb

## 2022-12-02 DIAGNOSIS — E782 Mixed hyperlipidemia: Secondary | ICD-10-CM

## 2022-12-02 DIAGNOSIS — I10 Essential (primary) hypertension: Secondary | ICD-10-CM

## 2022-12-02 DIAGNOSIS — Z952 Presence of prosthetic heart valve: Secondary | ICD-10-CM | POA: Diagnosis not present

## 2022-12-02 NOTE — Assessment & Plan Note (Signed)
Echo [facility] 09/2021  1. The left ventricular systolic function is normal, LVEF is visually  estimated at 60-65%.    2. The right ventricle is normal in size, with normal systolic function.    3. Aortic valve replacement (26 mm Edwards SAPIEN TAVR, implantation date:  08/01/2020).   4. Aortic valve Doppler indices are consistent with normal prosthetic valve  function.   5. There is trace regurgitation of the prosthetic aortic valve.

## 2022-12-02 NOTE — Assessment & Plan Note (Signed)
LDL improved on pravastatin 80 mg. LDL still elevated. Patient restarting fish oil. Recheck in 3 months.

## 2022-12-02 NOTE — Assessment & Plan Note (Signed)
B/p improved. Continue current medications.

## 2022-12-02 NOTE — Progress Notes (Signed)
Cardiology Office Note   Date:  12/02/2022   ID:  Richard [name], DOB 02-02-1951, MRN [id number]  PCP:  No primary care provider on file.  Cardiologist:  Adrian Blackwater, MD      History of Present Illness: Richard [name] is a 72 y.o. male who presents for  Chief Complaint  Patient presents with   Follow-up    2 month follow up    Patient in office for 3 month follow up.    Past Medical History:  Diagnosis Date   Aortic stenosis    Hyperlipidemia    Hypertension    Mitral valve regurgitation      Past Surgical History:  Procedure Laterality Date   NO PAST SURGERIES     RIGHT/LEFT HEART CATH AND CORONARY ANGIOGRAPHY Bilateral 06/20/2020   Procedure: RIGHT/LEFT HEART CATH AND CORONARY ANGIOGRAPHY;  Surgeon: Laurier Nancy, MD;  Location: [facility] INVASIVE CV LAB;  Service: Cardiovascular;  Laterality: Bilateral;     Current Outpatient Medications  Medication Sig Dispense Refill   aspirin 81 MG chewable tablet Chew by mouth.     Cholecalciferol (VITAMIN D3) 50 MCG (2000 UT) TABS Take 2,000 Units by mouth 3 (three) times a week.     Coenzyme Q10 (CO Q 10) 100 MG CAPS Take 100 mg by mouth daily.     hydrALAZINE (APRESOLINE) 25 MG tablet TAKE 1 TABLET BY MOUTH TWICE A DAY 60 tablet 3   losartan (COZAAR) 50 MG tablet Take 1 tablet by mouth daily.     pravastatin (PRAVACHOL) 80 MG tablet TAKE 1 TABLET BY MOUTH EVERY DAY 30 tablet 3   No current facility-administered medications for this visit.   Facility-Administered Medications Ordered in Other Visits  Medication Dose Route Frequency Provider Last Rate Last Admin   sodium chloride flush (NS) 0.9 % injection 3 mL  3 mL Intravenous Q12H Laurier Nancy, MD        Allergies:   Crestor [rosuvastatin] and Zetia [ezetimibe]    Social History:   reports that he has never smoked. He has quit using smokeless tobacco.  His smokeless tobacco use included chew and snuff. He reports current alcohol use. He reports that he  does not use drugs.   Family History:  family history includes Colon cancer in his mother; Hyperlipidemia in his mother; Other in his father.    ROS:     Review of Systems  Constitutional: Negative.   HENT: Negative.    Eyes: Negative.   Respiratory: Negative.    Cardiovascular: Negative.   Gastrointestinal: Negative.   Genitourinary: Negative.   Musculoskeletal: Negative.   Skin: Negative.   Neurological: Negative.   Endo/Heme/Allergies: Negative.   Psychiatric/Behavioral: Negative.    All other systems reviewed and are negative.   All other systems are reviewed and negative.   PHYSICAL EXAM: VS:  BP 132/78 (BP Location: Right Arm, Patient Position: Sitting, Cuff Size: Large)   Pulse 75   Ht 5\' 9"  (1.753 m)   Wt 226 lb 9.6 oz (102.8 kg)   SpO2 97%   BMI 33.46 kg/m  , BMI Body mass index is 33.46 kg/m. Last weight:  Wt Readings from Last 3 Encounters:  12/02/22 226 lb 9.6 oz (102.8 kg)  11/16/20 208 lb 11.2 oz (94.7 kg)  06/20/20 200 lb (90.7 kg)    Physical Exam Vitals reviewed.  Constitutional:      Appearance: Normal appearance. He is normal weight.  HENT:     Head:  Normocephalic.     Nose: Nose normal.     Mouth/Throat:     Mouth: Mucous membranes are moist.  Eyes:     Pupils: Pupils are equal, round, and reactive to light.  Cardiovascular:     Rate and Rhythm: Normal rate and regular rhythm.     Pulses: Normal pulses.     Heart sounds: Normal heart sounds.  Pulmonary:     Effort: Pulmonary effort is normal.  Abdominal:     General: Abdomen is flat. Bowel sounds are normal.  Musculoskeletal:        General: Normal range of motion.     Cervical back: Normal range of motion.  Skin:    General: Skin is warm.  Neurological:     General: No focal deficit present.     Mental Status: He is alert.  Psychiatric:        Mood and Affect: Mood normal.     EKG: none ekg  Recent Labs: 11/25/2022: ALT 27; BUN 12; Creatinine, Ser 1.11; Potassium 4.5; Sodium  140    Lipid Panel    Component Value Date/Time   CHOL 191 11/25/2022 0916   TRIG 197 (H) 11/25/2022 0916   HDL 39 (L) 11/25/2022 0916   CHOLHDL 8.0 05/14/2020 1034   VLDL 37 05/14/2020 1034   LDLCALC 117 (H) 11/25/2022 0916     Other studies Reviewed:  Patient: [id number] Richard [name] DOB:  July 12, 1951    Date:  06/12/2020 08:00 Provider: Adrian Blackwater MD Encounter: NUCLEAR STRESS TEST Amended 06/19/2020 09:31 by [facility]   Page 1 TESTS   [facility] [address] [place], [place] [address] [phone]   STUDY:  Exercise Stress Test SEX: Male         WEIGHT: 200 lbs  HEIGHT: 69 in                                                                                                                                                                                                           REFERRING PHYSICIAN: Dr.Melinda Pottinger Welton Flakes  INDICATION FOR STUDY: Nonrheumatic aortic valve stenosis                                                                                                                                                                                                                    STRESS BY:  Adrian Blackwater, MD  PROTOCOL:   Smitty Cords                                                                                      MAX PRED HR: 154                     85%: 131               75%:  116                                          RESTING BP: 124/80   RESTING HR: 75   PEAK BP: 138/78   PEAK HR: 136 (88%)                                                             EXERCISE DURATION:  8:30                                                METS: 10.1  REASON FOR TEST TERMINATION: Dizziness, Leg discomfort, Shortness of breath.                                                                                                                                 SYMPTOMS: Dizziness, Leg discomfort, Shortness of breath.   [place] TREADMILL SCORE: 9  RISK: Low                                                                                                                                                  EKG RESULTS: NSR. 74/min. T-wave inversion in VI, no ST wave changes.  IMPRESSION: Patient with severe aortic stenosis, EKG normal but with dizziness and shortness of breath plan on trans aortic valve replacement workup.                                                                                                                                                                                                                     Adrian BlackwaterShaukat Sandro Burgo, MD  Stress Interpreting Physician                  Adrian BlackwaterShaukat Twan Harkin MD  Electronically signed by: Adrian BlackwaterSHAUKAT Syreeta Figler     Date: 06/19/2020 13:01  Patient: [id number] - Richard PouchMichael [name] DOB:  [birth year]   Date:  06/02/2020 09:30 Provider: Adrian BlackwaterKhan, Ringo Sherod MD Encounter: ALL ANGIOGRAMS (CTA BRAIN, CAROTIDS, RENAL ARTERIES, PE)   Page 2 REASON FOR VISIT  Referred by Dr.Kimo Bancroft Welton FlakesKhan.    TESTS  Imaging: Computed Tomographic Angiography:  Cardiac multidetector CT was performed paying particular attention to the coronary arteries for the diagnosis of: Precordial Pain. Diagnostic Drugs:  Administered iohexol (Omnipaque) through an antecubital vein and images from the examination were analyzed for the presence and extent of  coronary artery disease, using 3D image processing software. 100 mL of non-ionic contrast (Omnipaque) was used.   TEST CONCLUSIONS  Quality of study: Fair.  1-Calcium score: 107.0  2-Right dominant system.  3-Normal coronaries.  Adrian BlackwaterShaukat Oden Lindaman MD  Electronically signed by: Adrian BlackwaterSHAUKAT Jaxsyn Azam     Date: 06/05/2020 11:07  Patient: [id number] - Richard PouchMichael [name] DOB:  [birth year]   Date:  05/31/2020 11:30 Provider: Adrian BlackwaterKhan, Bich Mchaney MD Encounter: ECHO   Page 2 REASON FOR VISIT  Visit for: Echocardiogram - Abnormal ECG  Sex: Male     wt= 210 lbs.  BP= 150/90  Height= 69 inches.    TESTS  Imaging: Echocardiogram:  An echocardiogram in (2-d) mode was performed and in Doppler mode with color flow velocity mapping was performed. The aortic valve cusps are abnormal 1.6 cm.   Aortic valve flow velocity was normal 4.3  m/s and normal calculated aortic valve systolic mean flow gradient 4.8 mmHg  Peak Flow Gradient is 76.0 mmHg. Mitral valve diastolic peak flow velocity E .69 m/s and E/A ratio 0.5. Aortic root diameter 3.4 cm. The LVOT internal diameter was normal 1.3 cm and flow velocity was normal 1.2 m/s. LV systolic dimension 2.3 cm, diastolic 3.8 cm, posterior wall thickness 1.1 cm, fractional shortening 39 %, and EF 70 %. IVS thickness 2.0 cm. LA dimension 5.1 cm  RIGHT atrium= 14.4  cm2. Mitral Valve =  Ea= 8.1  DT= 303 msec. Tricuspid Valve =  TR jet V= 2.5      RAP= 5  RVSP= 31.0 mmHg. Pulmonic Valve= PIEDV= .42 m/s. Aortic Valve has Mild Regurgitation and Severe Stenosis. Mitral Valve has Trace to Moderate Regurgitation. Pulmonic Valve has Trace Regurgitation. Tricuspid Valve has Mild Regurgitation.     ASSESSMENT  Technically difficult study due to body habitus.  Moderately dilated left atrium with left ventricle, right atrium and ventricle, and aorta appearing normal in size.  Normal left ventricular systolic function.  Mild to moderate left ventricular hypertrophy with GRADE 1  (relaxation abnormality) diastolic dysfunction.  Normal right ventricular systolic function.  Right ventricular diastolic dysfunction.  Normal left ventricular wall motion.  Trace pulmonary regurgitation.  Mild tricuspid regurgitation.  Normal pulmonary artery pressure.  Mild mitral annular calcification with mild to moderate mitral regurgitation.  Fibrocalcified aortic valve with mild aortic regurgitation and severe aortic stenosis with AVA 0.3 cm2, mean gradient 748.0 mmHg, and peak gradient 76.0 mmHg.  No pericardial effusion.  No subcostal view.     THERAPY   Referring physician: Laurier Nancy  Sonographer: Richardson Landry, RCS.                                                           Adrian Blackwater MD  Electronically signed by: Adrian Blackwater     Date: 06/02/2020 11:15   ASSESSMENT AND PLAN:    ICD-10-CM   1. Primary hypertension  I10     2. Mixed hyperlipidemia  E78.2 Lipid Profile    3. History of aortic valve replacement  Z95.2        Problem List Items Addressed This Visit       Cardiovascular and Mediastinum   Primary hypertension - Primary    B/p improved. Continue current medications.         Other   History of aortic valve replacement    Echo [facility] 09/2021  1. The left ventricular systolic function is normal, LVEF is visually  estimated at 60-65%.    2. The right ventricle is normal in size, with normal systolic function.    3. Aortic valve replacement (26 mm Edwards SAPIEN TAVR, implantation date:  08/01/2020).   4. Aortic valve Doppler indices are consistent with normal prosthetic valve  function.   5. There is trace regurgitation of the prosthetic aortic valve.        Mixed hyperlipidemia    LDL improved on pravastatin 80 mg. LDL still elevated. Patient restarting fish oil. Recheck in 3 months.       Relevant Orders   Lipid Profile     Disposition:   Return in about 4 months (around 04/03/2023).    Total time spent: 30  minutes  Signed,  Adrian Blackwater, MD  12/02/2022 10:06 AM    [facility]

## 2022-12-30 ENCOUNTER — Other Ambulatory Visit: Payer: Self-pay | Admitting: Cardiovascular Disease

## 2023-02-11 ENCOUNTER — Other Ambulatory Visit: Payer: Self-pay | Admitting: Cardiovascular Disease

## 2023-02-11 DIAGNOSIS — E782 Mixed hyperlipidemia: Secondary | ICD-10-CM

## 2023-02-11 DIAGNOSIS — I10 Essential (primary) hypertension: Secondary | ICD-10-CM

## 2023-03-27 ENCOUNTER — Other Ambulatory Visit: Payer: Self-pay | Admitting: Cardiovascular Disease

## 2023-03-28 ENCOUNTER — Other Ambulatory Visit: Payer: Medicare Other

## 2023-03-28 DIAGNOSIS — E782 Mixed hyperlipidemia: Secondary | ICD-10-CM

## 2023-04-03 ENCOUNTER — Encounter: Payer: Self-pay | Admitting: Cardiovascular Disease

## 2023-04-03 ENCOUNTER — Ambulatory Visit: Payer: Medicare Other | Admitting: Cardiovascular Disease

## 2023-04-03 VITALS — BP 162/100 | HR 91 | Ht 69.0 in | Wt 229.0 lb

## 2023-04-03 DIAGNOSIS — E782 Mixed hyperlipidemia: Secondary | ICD-10-CM | POA: Diagnosis not present

## 2023-04-03 DIAGNOSIS — I10 Essential (primary) hypertension: Secondary | ICD-10-CM | POA: Diagnosis not present

## 2023-04-03 DIAGNOSIS — Z952 Presence of prosthetic heart valve: Secondary | ICD-10-CM | POA: Diagnosis not present

## 2023-04-03 DIAGNOSIS — I350 Nonrheumatic aortic (valve) stenosis: Secondary | ICD-10-CM

## 2023-04-03 MED ORDER — SPIRONOLACTONE 25 MG PO TABS: 25.0000 mg | ORAL_TABLET | Freq: Every day | ORAL | 11 refills | Status: DC

## 2023-04-03 MED ORDER — AMOXICILLIN-POT CLAVULANATE 500-125 MG PO TABS: 4.0000 | ORAL_TABLET | ORAL | Status: DC | PRN

## 2023-04-03 NOTE — Progress Notes (Signed)
Cardiology Office Note   Date:  04/03/2023   ID:  Richard [name], DOB Oct 19, 1950, MRN [id number]  PCP:  No primary care provider on file.  Cardiologist:  Adrian Blackwater, MD      History of Present Illness: Richard [name] is a 72 y.o. male who presents for  Chief Complaint  Patient presents with   Follow-up    4 Months Follow Up    Patient denies any chest pain or shortness of breath but blood pressure is still elevated.  He says he needs antibiotic prior to dental procedure.      Past Medical History:  Diagnosis Date   Aortic stenosis    Hyperlipidemia    Hypertension    Mitral valve regurgitation      Past Surgical History:  Procedure Laterality Date   NO PAST SURGERIES     RIGHT/LEFT HEART CATH AND CORONARY ANGIOGRAPHY Bilateral 06/20/2020   Procedure: RIGHT/LEFT HEART CATH AND CORONARY ANGIOGRAPHY;  Surgeon: Laurier Nancy, MD;  Location: [facility];  Service: Cardiovascular;  Laterality: Bilateral;     Current Outpatient Medications  Medication Sig Dispense Refill   spironolactone (ALDACTONE) 25 MG tablet Take 1 tablet (25 mg total) by mouth daily. 30 tablet 11   aspirin 81 MG chewable tablet Chew by mouth.     Cholecalciferol (VITAMIN D3) 50 MCG (2000 UT) TABS Take 2,000 Units by mouth 3 (three) times a week.     Coenzyme Q10 (CO Q 10) 100 MG CAPS Take 100 mg by mouth daily.     hydrALAZINE (APRESOLINE) 25 MG tablet TAKE 1 TABLET BY MOUTH TWICE A DAY 180 tablet 1   losartan (COZAAR) 100 MG tablet TAKE 1 TABLET BY MOUTH EVERY DAY 90 tablet 0   pravastatin (PRAVACHOL) 80 MG tablet TAKE 1 TABLET BY MOUTH EVERY DAY 90 tablet 1   Current Facility-Administered Medications  Medication Dose Route Frequency Provider Last Rate Last Admin   amoxicillin-clavulanate (AUGMENTIN) 500-125 MG per tablet 4 tablet  4 tablet Oral PRN        Facility-Administered Medications Ordered in Other Visits  Medication Dose Route Frequency Provider Last Rate Last  Admin   sodium chloride flush (NS) 0.9 % injection 3 mL  3 mL Intravenous Q12H Laurier Nancy, MD        Allergies:   Crestor [rosuvastatin] and Zetia [ezetimibe]    Social History:   reports that he has never smoked. He has quit using smokeless tobacco.  His smokeless tobacco use included chew and snuff. He reports current alcohol use. He reports that he does not use drugs.   Family History:  family history includes Colon cancer in his mother; Hyperlipidemia in his mother; Other in his father.    ROS:     Review of Systems  Constitutional: Negative.   HENT: Negative.    Eyes: Negative.   Respiratory: Negative.    Gastrointestinal: Negative.   Genitourinary: Negative.   Musculoskeletal: Negative.   Skin: Negative.   Neurological: Negative.   Endo/Heme/Allergies: Negative.   Psychiatric/Behavioral: Negative.    All other systems reviewed and are negative.     All other systems are reviewed and negative.    PHYSICAL EXAM: VS:  BP (!) 162/100   Pulse 91   Ht 5\' 9"  (1.753 m)   Wt 229 lb (103.9 kg)   SpO2 97%   BMI 33.82 kg/m  , BMI Body mass index is 33.82 kg/m. Last weight:  Wt Readings  from Last 3 Encounters:  04/03/23 229 lb (103.9 kg)  12/02/22 226 lb 9.6 oz (102.8 kg)  11/16/20 208 lb 11.2 oz (94.7 kg)     Physical Exam Vitals reviewed.  Constitutional:      Appearance: Normal appearance. He is normal weight.  HENT:     Head: Normocephalic.     Nose: Nose normal.     Mouth/Throat:     Mouth: Mucous membranes are moist.  Eyes:     Pupils: Pupils are equal, round, and reactive to light.  Cardiovascular:     Rate and Rhythm: Normal rate and regular rhythm.     Pulses: Normal pulses.     Heart sounds: Normal heart sounds.  Pulmonary:     Effort: Pulmonary effort is normal.  Abdominal:     General: Abdomen is flat. Bowel sounds are normal.  Musculoskeletal:        General: Normal range of motion.     Cervical back: Normal range of motion.  Skin:     General: Skin is warm.  Neurological:     General: No focal deficit present.     Mental Status: He is alert.  Psychiatric:        Mood and Affect: Mood normal.       EKG:   Recent Labs: 11/25/2022: ALT 27; BUN 12; Creatinine, Ser 1.11; Potassium 4.5; Sodium 140    Lipid Panel    Component Value Date/Time   CHOL 225 (H) 03/28/2023 0852   TRIG 236 (H) 03/28/2023 0852   HDL 37 (L) 03/28/2023 0852   CHOLHDL 6.1 (H) 03/28/2023 0852   CHOLHDL 8.0 05/14/2020 1034   VLDL 37 05/14/2020 1034   LDLCALC 145 (H) 03/28/2023 0852      Other studies Reviewed: Additional studies/ records that were reviewed today include:  Review of the above records demonstrates:       No data to display            ASSESSMENT AND PLAN:    ICD-10-CM   1. Primary hypertension  I10 spironolactone (ALDACTONE) 25 MG tablet    Basic metabolic panel    amoxicillin-clavulanate (AUGMENTIN) 500-125 MG per tablet 4 tablet   Repeat blood pressure is 165/90.  Will start the patient on Aldactone 25 once a day as had problem with hydrochlorothiazide.  Will check potassium/labs    2. History of aortic valve replacement  Z95.2 amoxicillin-clavulanate (AUGMENTIN) 500-125 MG per tablet 4 tablet   Sent to 2 gram of Augmentin prior to dental work an hour before procedure.    3. Severe aortic stenosis  I35.0     4. Mixed hyperlipidemia  E78.2        Problem List Items Addressed This Visit       Cardiovascular and Mediastinum   Severe aortic stenosis   Relevant Medications   spironolactone (ALDACTONE) 25 MG tablet   Primary hypertension - Primary   Relevant Medications   spironolactone (ALDACTONE) 25 MG tablet   amoxicillin-clavulanate (AUGMENTIN) 500-125 MG per tablet 4 tablet   Other Relevant Orders   Basic metabolic panel     Other   History of aortic valve replacement   Relevant Medications   amoxicillin-clavulanate (AUGMENTIN) 500-125 MG per tablet 4 tablet   Mixed hyperlipidemia   Relevant  Medications   spironolactone (ALDACTONE) 25 MG tablet       Disposition:   Return in about 4 weeks (around 05/01/2023).    Total time spent: 30 minutes  Signed,  Adrian Blackwater, MD  04/03/2023 10:09 AM    [facility]

## 2023-05-01 ENCOUNTER — Other Ambulatory Visit: Payer: Medicare Other

## 2023-05-01 DIAGNOSIS — I10 Essential (primary) hypertension: Secondary | ICD-10-CM

## 2023-05-01 DIAGNOSIS — E669 Obesity, unspecified: Secondary | ICD-10-CM

## 2023-05-01 DIAGNOSIS — E782 Mixed hyperlipidemia: Secondary | ICD-10-CM

## 2023-05-02 LAB — CBC WITH DIFFERENTIAL/PLATELET
Basophils Absolute: 0.1 10*3/uL (ref 0.0–0.2)
Basos: 1 %
EOS (ABSOLUTE): 0.1 10*3/uL (ref 0.0–0.4)
Eos: 1 %
Hematocrit: 44.9 % (ref 37.5–51.0)
Hemoglobin: 15.2 g/dL (ref 13.0–17.7)
Immature Grans (Abs): 0 10*3/uL (ref 0.0–0.1)
Immature Granulocytes: 0 %
Lymphocytes Absolute: 1.9 10*3/uL (ref 0.7–3.1)
Lymphs: 32 %
MCH: 31 pg (ref 26.6–33.0)
MCHC: 33.9 g/dL (ref 31.5–35.7)
MCV: 91 fL (ref 79–97)
Monocytes Absolute: 0.5 10*3/uL (ref 0.1–0.9)
Monocytes: 8 %
Neutrophils Absolute: 3.3 10*3/uL (ref 1.4–7.0)
Neutrophils: 58 %
Platelets: 247 10*3/uL (ref 150–450)
RBC: 4.91 x10E6/uL (ref 4.14–5.80)
RDW: 13.2 % (ref 11.6–15.4)
WBC: 5.8 10*3/uL (ref 3.4–10.8)

## 2023-05-02 LAB — LIPID PANEL
Chol/HDL Ratio: 6.2 ratio — ABNORMAL HIGH (ref 0.0–5.0)
Cholesterol, Total: 236 mg/dL — ABNORMAL HIGH (ref 100–199)
HDL: 38 mg/dL — ABNORMAL LOW (ref >39)
LDL Chol Calc (NIH): 159 mg/dL — ABNORMAL HIGH (ref 0–99)
Triglycerides: 211 mg/dL — ABNORMAL HIGH (ref 0–149)
VLDL Cholesterol Cal: 39 mg/dL (ref 5–40)

## 2023-05-02 LAB — CMP14+EGFR
ALT: 26 IU/L (ref 0–44)
AST: 26 IU/L (ref 0–40)
Albumin: 4.4 g/dL (ref 3.9–4.9)
Alkaline Phosphatase: 54 IU/L (ref 44–121)
BUN/Creatinine Ratio: 15 (ref 10–24)
BUN: 16 mg/dL (ref 8–27)
Bilirubin Total: 0.5 mg/dL (ref 0.0–1.2)
CO2: 24 mmol/L (ref 20–29)
Calcium: 9.5 mg/dL (ref 8.6–10.2)
Chloride: 101 mmol/L (ref 96–106)
Creatinine, Ser: 1.07 mg/dL (ref 0.76–1.27)
Globulin, Total: 2.6 g/dL (ref 1.5–4.5)
Glucose: 106 mg/dL — ABNORMAL HIGH (ref 70–99)
Potassium: 4.6 mmol/L (ref 3.5–5.2)
Sodium: 138 mmol/L (ref 134–144)
Total Protein: 7 g/dL (ref 6.0–8.5)
eGFR: 75 mL/min/{1.73_m2} (ref >59)

## 2023-05-02 LAB — TSH: TSH: 1.56 u[IU]/mL (ref 0.450–4.500)

## 2023-05-02 LAB — HEMOGLOBIN A1C
Est. average glucose Bld gHb Est-mCnc: 126 mg/dL
Hgb A1c MFr Bld: 6 % — ABNORMAL HIGH (ref 4.8–5.6)

## 2023-05-05 ENCOUNTER — Encounter: Payer: Self-pay | Admitting: Cardiovascular Disease

## 2023-05-05 ENCOUNTER — Ambulatory Visit: Payer: Medicare Other | Admitting: Cardiovascular Disease

## 2023-05-05 VITALS — BP 153/89 | HR 82 | Ht 70.0 in | Wt 230.6 lb

## 2023-05-05 DIAGNOSIS — E782 Mixed hyperlipidemia: Secondary | ICD-10-CM | POA: Diagnosis not present

## 2023-05-05 DIAGNOSIS — I350 Nonrheumatic aortic (valve) stenosis: Secondary | ICD-10-CM

## 2023-05-05 DIAGNOSIS — Z952 Presence of prosthetic heart valve: Secondary | ICD-10-CM

## 2023-05-05 DIAGNOSIS — I10 Essential (primary) hypertension: Secondary | ICD-10-CM | POA: Diagnosis not present

## 2023-05-05 MED ORDER — PITAVASTATIN CALCIUM 4 MG PO TABS: 4.0000 mg | ORAL_TABLET | Freq: Every day | ORAL | 3 refills | Status: DC

## 2023-05-05 MED ORDER — SPIRONOLACTONE 25 MG PO TABS
25.0000 mg | ORAL_TABLET | Freq: Every day | ORAL | 11 refills | Status: DC
Start: 2023-05-05 — End: 2024-04-19

## 2023-05-05 MED ORDER — LOSARTAN POTASSIUM-HCTZ 100-12.5 MG PO TABS
1.0000 | ORAL_TABLET | Freq: Every day | ORAL | 2 refills | Status: DC
Start: 2023-05-05 — End: 2024-01-22

## 2023-05-05 NOTE — Progress Notes (Signed)
Cardiology Office Note   Date:  05/05/2023   ID:  Richard [name], DOB 19-Feb-1951, MRN [id number]  PCP:  No primary care provider on file.  Cardiologist:  Adrian Blackwater, MD      History of Present Illness: Richard [name] is a 72 y.o. male who presents for No chief complaint on file.   On losartan100/12.5, feels fine      Past Medical History:  Diagnosis Date   Aortic stenosis    Hyperlipidemia    Hypertension    Mitral valve regurgitation      Past Surgical History:  Procedure Laterality Date   NO PAST SURGERIES     RIGHT/LEFT HEART CATH AND CORONARY ANGIOGRAPHY Bilateral 06/20/2020   Procedure: RIGHT/LEFT HEART CATH AND CORONARY ANGIOGRAPHY;  Surgeon: Laurier Nancy, MD;  Location: [facility];  Service: Cardiovascular;  Laterality: Bilateral;     Current Outpatient Medications  Medication Sig Dispense Refill   losartan-hydrochlorothiazide (HYZAAR) 100-12.5 MG tablet Take 1 tablet by mouth daily. 90 tablet 2   aspirin 81 MG chewable tablet Chew by mouth.     Cholecalciferol (VITAMIN D3) 50 MCG (2000 UT) TABS Take 2,000 Units by mouth 3 (three) times a week.     Coenzyme Q10 (CO Q 10) 100 MG CAPS Take 100 mg by mouth daily.     hydrALAZINE (APRESOLINE) 25 MG tablet TAKE 1 TABLET BY MOUTH TWICE A DAY 180 tablet 1   Pitavastatin Calcium (LIVALO) 4 MG TABS Take 1 tablet (4 mg total) by mouth daily. 90 tablet 3   spironolactone (ALDACTONE) 25 MG tablet Take 1 tablet (25 mg total) by mouth daily. 30 tablet 11   Current Facility-Administered Medications  Medication Dose Route Frequency Provider Last Rate Last Admin   amoxicillin-clavulanate (AUGMENTIN) 500-125 MG per tablet 4 tablet  4 tablet Oral PRN        Facility-Administered Medications Ordered in Other Visits  Medication Dose Route Frequency Provider Last Rate Last Admin   sodium chloride flush (NS) 0.9 % injection 3 mL  3 mL Intravenous Q12H Laurier Nancy, MD        Allergies:   Crestor  [rosuvastatin] and Zetia [ezetimibe]    Social History:   reports that he has never smoked. He has quit using smokeless tobacco.  His smokeless tobacco use included chew and snuff. He reports current alcohol use. He reports that he does not use drugs.   Family History:  family history includes Colon cancer in his mother; Hyperlipidemia in his mother; Other in his father.    ROS:     Review of Systems  Constitutional: Negative.   HENT: Negative.    Eyes: Negative.   Respiratory: Negative.    Gastrointestinal: Negative.   Genitourinary: Negative.   Musculoskeletal: Negative.   Skin: Negative.   Neurological: Negative.   Endo/Heme/Allergies: Negative.   Psychiatric/Behavioral: Negative.    All other systems reviewed and are negative.     All other systems are reviewed and negative.    PHYSICAL EXAM: VS:  BP (!) 153/89   Pulse 82   Ht 5\' 10"  (1.778 m)   Wt 230 lb 9.6 oz (104.6 kg)   SpO2 99%   BMI 33.09 kg/m  , BMI Body mass index is 33.09 kg/m. Last weight:  Wt Readings from Last 3 Encounters:  05/05/23 230 lb 9.6 oz (104.6 kg)  04/03/23 229 lb (103.9 kg)  12/02/22 226 lb 9.6 oz (102.8 kg)  Physical Exam Vitals reviewed.  Constitutional:      Appearance: Normal appearance. He is normal weight.  HENT:     Head: Normocephalic.     Nose: Nose normal.     Mouth/Throat:     Mouth: Mucous membranes are moist.  Eyes:     Pupils: Pupils are equal, round, and reactive to light.  Cardiovascular:     Rate and Rhythm: Normal rate and regular rhythm.     Pulses: Normal pulses.     Heart sounds: Normal heart sounds.  Pulmonary:     Effort: Pulmonary effort is normal.  Abdominal:     General: Abdomen is flat. Bowel sounds are normal.  Musculoskeletal:        General: Normal range of motion.     Cervical back: Normal range of motion.  Skin:    General: Skin is warm.  Neurological:     General: No focal deficit present.     Mental Status: He is alert.   Psychiatric:        Mood and Affect: Mood normal.       EKG:   Recent Labs: 05/01/2023: ALT 26; BUN 16; Creatinine, Ser 1.07; Hemoglobin 15.2; Platelets 247; Potassium 4.6; Sodium 138; TSH 1.560    Lipid Panel    Component Value Date/Time   CHOL 236 (H) 05/01/2023 0901   TRIG 211 (H) 05/01/2023 0901   HDL 38 (L) 05/01/2023 0901   CHOLHDL 6.2 (H) 05/01/2023 0901   CHOLHDL 8.0 05/14/2020 1034   VLDL 37 05/14/2020 1034   LDLCALC 159 (H) 05/01/2023 0901      Other studies Reviewed: Additional studies/ records that were reviewed today include:  Review of the above records demonstrates:       No data to display            ASSESSMENT AND PLAN:    ICD-10-CM   1. Severe aortic stenosis  I35.0 losartan-hydrochlorothiazide (HYZAAR) 100-12.5 MG tablet    spironolactone (ALDACTONE) 25 MG tablet    2. Primary hypertension  I10 losartan-hydrochlorothiazide (HYZAAR) 100-12.5 MG tablet    spironolactone (ALDACTONE) 25 MG tablet    3. Mixed hyperlipidemia  E78.2 losartan-hydrochlorothiazide (HYZAAR) 100-12.5 MG tablet    spironolactone (ALDACTONE) 25 MG tablet   LDL over 159, goal 55. stop pravachol, try Livalo4 mg daily, as unable to tolerate, zetia,crestor, and lipitor.    4. History of aortic valve replacement  Z95.2 losartan-hydrochlorothiazide (HYZAAR) 100-12.5 MG tablet    spironolactone (ALDACTONE) 25 MG tablet    5. Primary hypertension  I10 losartan-hydrochlorothiazide (HYZAAR) 100-12.5 MG tablet    spironolactone (ALDACTONE) 25 MG tablet   Repeat blood pressure is 155/90.  Will start the patient on Aldactone 25 once a day as well as hyzaar 100/12.5  Will check potassium/labs       Problem List Items Addressed This Visit       Cardiovascular and Mediastinum   Severe aortic stenosis - Primary   Relevant Medications   losartan-hydrochlorothiazide (HYZAAR) 100-12.5 MG tablet   spironolactone (ALDACTONE) 25 MG tablet   Pitavastatin Calcium (LIVALO) 4 MG TABS    Primary hypertension   Relevant Medications   losartan-hydrochlorothiazide (HYZAAR) 100-12.5 MG tablet   spironolactone (ALDACTONE) 25 MG tablet   Pitavastatin Calcium (LIVALO) 4 MG TABS     Other   History of aortic valve replacement   Relevant Medications   losartan-hydrochlorothiazide (HYZAAR) 100-12.5 MG tablet   spironolactone (ALDACTONE) 25 MG tablet   Mixed hyperlipidemia   Relevant  Medications   losartan-hydrochlorothiazide (HYZAAR) 100-12.5 MG tablet   spironolactone (ALDACTONE) 25 MG tablet   Pitavastatin Calcium (LIVALO) 4 MG TABS       Disposition:   Return in about 2 weeks (around 05/19/2023).    Total time spent: 30 minutes  Signed,  Adrian Blackwater, MD  05/05/2023 10:23 AM    [facility]

## 2023-05-13 ENCOUNTER — Ambulatory Visit
Admission: EM | Admit: 2023-05-13 | Discharge: 2023-05-13 | Disposition: A | Discharge status: Home / Self Care | Payer: Medicare Other | Attending: Emergency Medicine | Admitting: Emergency Medicine

## 2023-05-13 ENCOUNTER — Encounter: Payer: Self-pay | Admitting: Emergency Medicine

## 2023-05-13 DIAGNOSIS — R002 Palpitations: Secondary | ICD-10-CM | POA: Insufficient documentation

## 2023-05-13 LAB — CBC WITH DIFFERENTIAL/PLATELET
Abs Immature Granulocytes: 0.02 10*3/uL (ref 0.00–0.07)
Basophils Absolute: 0 10*3/uL (ref 0.0–0.1)
Basophils Relative: 0 %
Eosinophils Absolute: 0.1 10*3/uL (ref 0.0–0.5)
Eosinophils Relative: 1 %
HCT: 44.8 % (ref 39.0–52.0)
Hemoglobin: 15.9 g/dL (ref 13.0–17.0)
Immature Granulocytes: 0 %
Lymphocytes Relative: 18 %
Lymphs Abs: 1.8 10*3/uL (ref 0.7–4.0)
MCH: 31.7 pg (ref 26.0–34.0)
MCHC: 35.5 g/dL (ref 30.0–36.0)
MCV: 89.2 fL (ref 80.0–100.0)
Monocytes Absolute: 0.9 10*3/uL (ref 0.1–1.0)
Monocytes Relative: 9 %
Neutro Abs: 6.9 10*3/uL (ref 1.7–7.7)
Neutrophils Relative %: 72 %
Platelets: 256 10*3/uL (ref 150–400)
RBC: 5.02 MIL/uL (ref 4.22–5.81)
RDW: 12.9 % (ref 11.5–15.5)
WBC: 9.7 10*3/uL (ref 4.0–10.5)
nRBC: 0 % (ref 0.0–0.2)

## 2023-05-13 LAB — BASIC METABOLIC PANEL
Anion gap: 10 (ref 5–15)
BUN: 23 mg/dL (ref 8–23)
CO2: 25 mmol/L (ref 22–32)
Calcium: 9.5 mg/dL (ref 8.9–10.3)
Chloride: 100 mmol/L (ref 98–111)
Creatinine, Ser: 1.12 mg/dL (ref 0.61–1.24)
GFR, Estimated: >60 mL/min (ref >60)
Glucose, Bld: 80 mg/dL (ref 70–99)
Potassium: 4 mmol/L (ref 3.5–5.1)
Sodium: 135 mmol/L (ref 135–145)

## 2023-05-13 LAB — MAGNESIUM: Magnesium: 2.2 mg/dL (ref 1.7–2.4)

## 2023-05-13 MED ORDER — METOPROLOL SUCCINATE ER 50 MG PO TB24: 50.0000 mg | ORAL_TABLET | Freq: Every day | ORAL | 0 refills | Status: DC

## 2023-05-13 NOTE — Discharge Instructions (Addendum)
Your CBC, basic metabolic panel and magnesium, EKG were all normal.  I am unable to find a cause for your palpitations.  Left a voicemail at your cardiologist office as well as attempting to reach him via secure chat.  Please call their office and set up an appointment ASAP.  Go immediately to the ER for the signs and symptoms we discussed.  Here is a list of primary care providers who are taking new patients:  [facility] [place] Dr. Joseph Berkshire (sports medicine) Dr. Elizabeth Sauer [address] [place] [address] [phone]  [facility] [address] [place], [place] [address] [phone]  [facility] [place] [address]  [place] [address]  [phone]  [facility] [address] [place], [place] [address] [phone]  [facility] [address]  [phone] [place], [place] [address]   Go to www.goodrx.com  or www.costplusdrugs.com to look up your medications. This will give you a list of where you can find your prescriptions at the most affordable prices. Or ask the pharmacist what the cash price is, or if they have any other discount programs available to help make your medication more affordable. This can be less expensive than what you would pay with insurance.

## 2023-05-13 NOTE — ED Provider Notes (Signed)
HPI  SUBJECTIVE:  Richard [name] is a 72 y.o. male who presents with 1-1/2 hours of palpitations after eating dinner last night.  He describes it as his heart "skipping a beat." This occurs several times in a minute, but then goes back into rhythm.  He states that his pulse has been irregular on his pulse oximeter.  No chest pain, shortness of breath, chest pressure, heaviness, nausea, diaphoresis, lightheadedness, dizziness, abdominal pain.  He had 3 hours of palpitations after eating lunch today.  States that his heart rate was measured at 105, and his blood pressure was 154/73.  He is eating and drinking normally.  No vomiting, diarrhea.  Spironolactone was added to his losartan, hydrochlorothiazide and hydralazine last week.  He was also started on pivistatin.  He has an appointment in 1 week with his cardiologist.  He denies recent change in his caffeine intake, herbal products, illicit drugs, weight loss supplements, OTC decongestants.  He takes neck, CoQ 10, but this is not new.  He denies GERD symptoms.  He tried walking around, drinking water.  Symptoms are better with exertion, worse with eating.  He has had similar symptoms before, but was never evaluated for this.  He denies anxiety, unintentional weight loss, skin or hair changes, temperature intolerance.  Patient has a past medical history of hypertension, diabetes, severe aortic stenosis status post valve replacement December 2021, hyperlipidemia.  No history of arrhythmia, hypercoagulability, thyroid disease.  PCP: [facility].  PCP: None.  Past Medical History:  Diagnosis Date   Aortic stenosis    Hyperlipidemia    Hypertension    Mitral valve regurgitation     Past Surgical History:  Procedure Laterality Date   NO PAST SURGERIES     RIGHT/LEFT HEART CATH AND CORONARY ANGIOGRAPHY Bilateral 06/20/2020   Procedure: RIGHT/LEFT HEART CATH AND CORONARY ANGIOGRAPHY;  Surgeon: Laurier Nancy, MD;  Location: [facility] INV[facility]IVE CV  [facility];  Service: Cardiovascular;  Laterality: Bilateral;    Family History  Problem Relation Age of Onset   Colon cancer Mother    Hyperlipidemia Mother    Other Father        unknown medical istory    Social History   Tobacco Use   Smoking status: Never   Smokeless tobacco: Former    Types: Chew, Snuff  Vaping Use   Vaping status: Never Used  Substance Use Topics   Alcohol use: Yes    Comment: occassional   Drug use: Never     Current Facility-Administered Medications:    amoxicillin-clavulanate (AUGMENTIN) 500-125 MG per tablet 4 tablet, 4 tablet, Oral, PRN,   Current Outpatient Medications:    hydrALAZINE (APRESOLINE) 25 MG tablet, TAKE 1 TABLET BY MOUTH TWICE A DAY, Disp: 180 tablet, Rfl: 1   losartan-hydrochlorothiazide (HYZAAR) 100-12.5 MG tablet, Take 1 tablet by mouth daily., Disp: 90 tablet, Rfl: 2   metoprolol succinate (TOPROL XL) 50 MG 24 hr tablet, Take 1 tablet (50 mg total) by mouth daily. Take with or immediately following a meal., Disp: 30 tablet, Rfl: 0   Pitavastatin Calcium (LIVALO) 4 MG TABS, Take 1 tablet (4 mg total) by mouth daily., Disp: 90 tablet, Rfl: 3   spironolactone (ALDACTONE) 25 MG tablet, Take 1 tablet (25 mg total) by mouth daily., Disp: 30 tablet, Rfl: 11   aspirin 81 MG chewable tablet, Chew by mouth., Disp: , Rfl:    Cholecalciferol (VITAMIN D3) 50 MCG (2000 UT) TABS, Take 2,000 Units by mouth 3 (three) times a week.,  Disp: , Rfl:    Coenzyme Q10 (CO Q 10) 100 MG CAPS, Take 100 mg by mouth daily., Disp: , Rfl:   Facility-Administered Medications Ordered in Other Encounters:    sodium chloride flush (NS) 0.9 % injection 3 mL, 3 mL, Intravenous, Q12H, Adrian Blackwater A, MD  Allergies  Allergen Reactions   Crestor [Rosuvastatin] Diarrhea   Zetia [Ezetimibe] Photosensitivity and Rash     ROS  As noted in HPI.   Physical Exam  BP (!) 152/76   Pulse 90   Temp 98.4 F (36.9 C) (Oral)   Wt 102.1 kg   SpO2 100%   BMI 32.28 kg/m    Constitutional: Well developed, well nourished, no acute distress Eyes:  EOMI, conjunctiva normal bilaterally HENT: Normocephalic, atraumatic,mucus membranes moist.  No thyroid tenderness, thyromegaly Respiratory: Normal inspiratory effort, lungs clear bilaterally. Cardiovascular: Normal rate, regular rhythm.  Loud systolic ejection murmur. GI: nondistended skin: No rash, skin intact Musculoskeletal: no deformities Neurologic: Alert & oriented x 3, no focal neuro deficits Psychiatric: Speech and behavior appropriate   ED Course   Medications - No data to display  Orders Placed This Encounter  Procedures   CBC with Differential    Standing Status:   Standing    Number of Occurrences:   1   Basic metabolic panel    Standing Status:   Standing    Number of Occurrences:   1   Magnesium    Standing Status:   Standing    Number of Occurrences:   1   ED EKG    Standing Status:   Standing    Number of Occurrences:   1    Order Specific Question:   Reason for Exam    Answer:   Other (See Comments)    Order Specific Question:   Release to patient    Answer:   Immediate   EKG 12-Lead    Standing Status:   Standing    Number of Occurrences:   1    Results for orders placed or performed during the hospital encounter of 05/13/23 (from the past 24 hour(s))  CBC with Differential     Status: None   Collection Time: 05/13/23  4:27 PM  Result Value Ref Range   WBC 9.7 4.0 - 10.5 K/uL   RBC 5.02 4.22 - 5.81 MIL/uL   Hemoglobin 15.9 13.0 - 17.0 g/dL   HCT 40.9 81.1 - 91.4 %   MCV 89.2 80.0 - 100.0 fL   MCH 31.7 26.0 - 34.0 pg   MCHC 35.5 30.0 - 36.0 g/dL   RDW 78.2 95.6 - 21.3 %   Platelets 256 150 - 400 K/uL   nRBC 0.0 0.0 - 0.2 %   Neutrophils Relative % 72 %   Neutro Abs 6.9 1.7 - 7.7 K/uL   Lymphocytes Relative 18 %   Lymphs Abs 1.8 0.7 - 4.0 K/uL   Monocytes Relative 9 %   Monocytes Absolute 0.9 0.1 - 1.0 K/uL   Eosinophils Relative 1 %   Eosinophils Absolute 0.1 0.0  - 0.5 K/uL   Basophils Relative 0 %   Basophils Absolute 0.0 0.0 - 0.1 K/uL   Immature Granulocytes 0 %   Abs Immature Granulocytes 0.02 0.00 - 0.07 K/uL  Basic metabolic panel     Status: None   Collection Time: 05/13/23  4:27 PM  Result Value Ref Range   Sodium 135 135 - 145 mmol/L   Potassium 4.0 3.5 - 5.1 mmol/L  Chloride 100 98 - 111 mmol/L   CO2 25 22 - 32 mmol/L   Glucose, Bld 80 70 - 99 mg/dL   BUN 23 8 - 23 mg/dL   Creatinine, Ser 8.11 0.61 - 1.24 mg/dL   Calcium 9.5 8.9 - 91.4 mg/dL   GFR, Estimated >78 >29 mL/min   Anion gap 10 5 - 15  Magnesium     Status: None   Collection Time: 05/13/23  4:27 PM  Result Value Ref Range   Magnesium 2.2 1.7 - 2.4 mg/dL   No results found.  ED Clinical Impression  1. Palpitations      ED Assessment/Plan      EKG: Normal sinus rhythm, rate 90.  Left axis deviation.  Normal intervals.  No hypertrophy.  No ST elevation.  Nonspecific T wave flattening seen in 1 and aVL present in EKG from 04/2020.  No other ST T wave changes.  Patient was asymptomatic while EKG was obtained.  Labs reviewed.  Normal CBC, BMP, magnesium.  EKG is reassuring.  Doubt STEMI.  Advised patient that he will need to follow-up with his cardiologist ASAP.  I attempted to contact his cardiologist and left a voicemail at his office as well as attempting to reach him through secure chat.  Will provide primary care list for routine care.  Discussed case with Dr. Welton Flakes, patient's cardiologist.  Advised metoprolol succinate 50 mg daily.  E-prescribed this to pharmacy on record.  He will see the patient at 9:00 on Thursday in his office.  Left voicemail x 2 patient's phone and sent patient message through MyChart advising him of plan.  Discussed labs, EKG, MDM, treatment plan, and plan for follow-up with patient. Discussed sn/sx that should prompt return to the ED. patient agrees with plan.   Meds ordered this encounter  Medications   metoprolol succinate (TOPROL  XL) 50 MG 24 hr tablet    Sig: Take 1 tablet (50 mg total) by mouth daily. Take with or immediately following a meal.    Dispense:  30 tablet    Refill:  0      *This clinic note was created using Scientist, clinical (histocompatibility and immunogenetics). Therefore, there may be occasional mistakes despite careful proofreading.  ?    Domenick Gong, MD 05/16/23 [phone]

## 2023-05-13 NOTE — Telephone Encounter (Signed)
Attempted to reach patient x 1, LVM encouraging him to read [employer]

## 2023-05-13 NOTE — ED Triage Notes (Signed)
Patient states that he changed BP meds last week. Bp went down as it should. After he ate last night he felt heart palpitations. Took BP meds this morning. Checked pulse with pulse ox 105. No hx of afib.

## 2023-05-15 ENCOUNTER — Ambulatory Visit: Payer: Medicare Other | Admitting: Cardiovascular Disease

## 2023-05-15 ENCOUNTER — Encounter: Payer: Self-pay | Admitting: Cardiovascular Disease

## 2023-05-15 VITALS — BP 148/82 | HR 71 | Ht 69.0 in | Wt 226.2 lb

## 2023-05-15 DIAGNOSIS — Z952 Presence of prosthetic heart valve: Secondary | ICD-10-CM | POA: Diagnosis not present

## 2023-05-15 DIAGNOSIS — R002 Palpitations: Secondary | ICD-10-CM

## 2023-05-15 DIAGNOSIS — I10 Essential (primary) hypertension: Secondary | ICD-10-CM

## 2023-05-15 DIAGNOSIS — I350 Nonrheumatic aortic (valve) stenosis: Secondary | ICD-10-CM

## 2023-05-15 DIAGNOSIS — E782 Mixed hyperlipidemia: Secondary | ICD-10-CM

## 2023-05-15 MED ORDER — METOPROLOL SUCCINATE ER 25 MG PO TB24
25.0000 mg | ORAL_TABLET | Freq: Every day | ORAL | 11 refills | Status: DC
Start: 2023-05-15 — End: 2023-12-05

## 2023-05-15 NOTE — Progress Notes (Signed)
Cardiology Office Note   Date:  05/15/2023   ID:  DRAKO REINERS, DOB February 18, 1951, MRN [id number]  PCP:  Laurier Nancy, MD  Cardiologist:  Adrian Blackwater, MD      History of Present Illness: Richard [name] is a 72 y.o. male who presents for No chief complaint on file.   Had episode of palpitation for 3 hours and went to [employer].      Past Medical History:  Diagnosis Date   Aortic stenosis    Hyperlipidemia    Hypertension    Mitral valve regurgitation      Past Surgical History:  Procedure Laterality Date   NO PAST SURGERIES     RIGHT/LEFT HEART CATH AND CORONARY ANGIOGRAPHY Bilateral 06/20/2020   Procedure: RIGHT/LEFT HEART CATH AND CORONARY ANGIOGRAPHY;  Surgeon: Laurier Nancy, MD;  Location: [facility];  Service: Cardiovascular;  Laterality: Bilateral;     Current Outpatient Medications  Medication Sig Dispense Refill   metoprolol succinate (TOPROL XL) 25 MG 24 hr tablet Take 1 tablet (25 mg total) by mouth daily. 30 tablet 11   aspirin 81 MG chewable tablet Chew by mouth.     Cholecalciferol (VITAMIN D3) 50 MCG (2000 UT) TABS Take 2,000 Units by mouth 3 (three) times a week.     Coenzyme Q10 (CO Q 10) 100 MG CAPS Take 100 mg by mouth daily.     losartan-hydrochlorothiazide (HYZAAR) 100-12.5 MG tablet Take 1 tablet by mouth daily. 90 tablet 2   metoprolol succinate (TOPROL XL) 50 MG 24 hr tablet Take 1 tablet (50 mg total) by mouth daily. Take with or immediately following a meal. 30 tablet 0   Pitavastatin Calcium (LIVALO) 4 MG TABS Take 1 tablet (4 mg total) by mouth daily. 90 tablet 3   spironolactone (ALDACTONE) 25 MG tablet Take 1 tablet (25 mg total) by mouth daily. 30 tablet 11   Current Facility-Administered Medications  Medication Dose Route Frequency Provider Last Rate Last Admin   amoxicillin-clavulanate (AUGMENTIN) 500-125 MG per tablet 4 tablet  4 tablet Oral PRN        Facility-Administered Medications Ordered in Other Visits   Medication Dose Route Frequency Provider Last Rate Last Admin   sodium chloride flush (NS) 0.9 % injection 3 mL  3 mL Intravenous Q12H Laurier Nancy, MD        Allergies:   Crestor [rosuvastatin] and Zetia [ezetimibe]    Social History:   reports that he has never smoked. He has quit using smokeless tobacco.  His smokeless tobacco use included chew and snuff. He reports current alcohol use. He reports that he does not use drugs.   Family History:  family history includes Colon cancer in his mother; Hyperlipidemia in his mother; Other in his father.    ROS:     Review of Systems  Constitutional: Negative.   HENT: Negative.    Eyes: Negative.   Respiratory: Negative.    Gastrointestinal: Negative.   Genitourinary: Negative.   Musculoskeletal: Negative.   Skin: Negative.   Neurological: Negative.   Endo/Heme/Allergies: Negative.   Psychiatric/Behavioral: Negative.    All other systems reviewed and are negative.     All other systems are reviewed and negative.    PHYSICAL EXAM: VS:  BP (!) 148/82   Pulse 71   Ht 5\' 9"  (1.753 m)   Wt 226 lb 3.2 oz (102.6 kg)   SpO2 98%   BMI 33.40 kg/m  , BMI Body  mass index is 33.4 kg/m. Last weight:  Wt Readings from Last 3 Encounters:  05/15/23 226 lb 3.2 oz (102.6 kg)  05/13/23 225 lb (102.1 kg)  05/05/23 230 lb 9.6 oz (104.6 kg)     Physical Exam Vitals reviewed.  Constitutional:      Appearance: Normal appearance. He is normal weight.  HENT:     Head: Normocephalic.     Nose: Nose normal.     Mouth/Throat:     Mouth: Mucous membranes are moist.  Eyes:     Pupils: Pupils are equal, round, and reactive to light.  Cardiovascular:     Rate and Rhythm: Normal rate and regular rhythm.     Pulses: Normal pulses.     Heart sounds: Normal heart sounds.  Pulmonary:     Effort: Pulmonary effort is normal.  Abdominal:     General: Abdomen is flat. Bowel sounds are normal.  Musculoskeletal:        General: Normal range of  motion.     Cervical back: Normal range of motion.  Skin:    General: Skin is warm.  Neurological:     General: No focal deficit present.     Mental Status: He is alert.  Psychiatric:        Mood and Affect: Mood normal.       EKG:   Recent Labs: 05/01/2023: ALT 26; TSH 1.560 05/13/2023: BUN 23; Creatinine, Ser 1.12; Hemoglobin 15.9; Magnesium 2.2; Platelets 256; Potassium 4.0; Sodium 135    Lipid Panel    Component Value Date/Time   CHOL 236 (H) 05/01/2023 0901   TRIG 211 (H) 05/01/2023 0901   HDL 38 (L) 05/01/2023 0901   CHOLHDL 6.2 (H) 05/01/2023 0901   CHOLHDL 8.0 05/14/2020 1034   VLDL 37 05/14/2020 1034   LDLCALC 159 (H) 05/01/2023 0901      Other studies Reviewed: Additional studies/ records that were reviewed today include:  Review of the above records demonstrates:       No data to display            ASSESSMENT AND PLAN:    ICD-10-CM   1. History of aortic valve replacement  Z95.2 PCV ECHOCARDIOGRAM COMPLETE    CARDIAC EVENT MONITOR    metoprolol succinate (TOPROL XL) 25 MG 24 hr tablet    2. Mixed hyperlipidemia  E78.2 PCV ECHOCARDIOGRAM COMPLETE    CARDIAC EVENT MONITOR    metoprolol succinate (TOPROL XL) 25 MG 24 hr tablet    3. Primary hypertension  I10 PCV ECHOCARDIOGRAM COMPLETE    CARDIAC EVENT MONITOR    metoprolol succinate (TOPROL XL) 25 MG 24 hr tablet   BP repeat 120/55, stop hydralazine    4. Severe aortic stenosis  I35.0 PCV ECHOCARDIOGRAM COMPLETE    CARDIAC EVENT MONITOR    metoprolol succinate (TOPROL XL) 25 MG 24 hr tablet    5. Palpitation  R00.2 PCV ECHOCARDIOGRAM COMPLETE    CARDIAC EVENT MONITOR    metoprolol succinate (TOPROL XL) 25 MG 24 hr tablet   pulse was over 85-98 after startng aldactone, livalo. Took metorolol 25 day, get holter, and echo       Problem List Items Addressed This Visit       Cardiovascular and Mediastinum   Severe aortic stenosis   Relevant Medications   metoprolol succinate (TOPROL XL)  25 MG 24 hr tablet   Other Relevant Orders   PCV ECHOCARDIOGRAM COMPLETE   CARDIAC EVENT MONITOR   Primary hypertension  Relevant Medications   metoprolol succinate (TOPROL XL) 25 MG 24 hr tablet   Other Relevant Orders   PCV ECHOCARDIOGRAM COMPLETE   CARDIAC EVENT MONITOR     Other   History of aortic valve replacement - Primary   Relevant Medications   metoprolol succinate (TOPROL XL) 25 MG 24 hr tablet   Other Relevant Orders   PCV ECHOCARDIOGRAM COMPLETE   CARDIAC EVENT MONITOR   Mixed hyperlipidemia   Relevant Medications   metoprolol succinate (TOPROL XL) 25 MG 24 hr tablet   Other Relevant Orders   PCV ECHOCARDIOGRAM COMPLETE   CARDIAC EVENT MONITOR   Other Visit Diagnoses     Palpitation       pulse was over 85-98 after startng aldactone, livalo. Took metorolol 25 day, get holter, and echo   Relevant Medications   metoprolol succinate (TOPROL XL) 25 MG 24 hr tablet   Other Relevant Orders   PCV ECHOCARDIOGRAM COMPLETE   CARDIAC EVENT MONITOR          Disposition:   Return in about 4 weeks (around 06/12/2023) for echo, holter  and f/u.    Total time spent: 30 minutes  Signed,  Adrian Blackwater, MD  05/15/2023 9:48 AM    [facility]

## 2023-05-16 ENCOUNTER — Ambulatory Visit: Payer: Medicare Other

## 2023-05-16 DIAGNOSIS — E782 Mixed hyperlipidemia: Secondary | ICD-10-CM

## 2023-05-16 DIAGNOSIS — I350 Nonrheumatic aortic (valve) stenosis: Secondary | ICD-10-CM

## 2023-05-16 DIAGNOSIS — R002 Palpitations: Secondary | ICD-10-CM | POA: Diagnosis not present

## 2023-05-16 DIAGNOSIS — I10 Essential (primary) hypertension: Secondary | ICD-10-CM

## 2023-05-16 DIAGNOSIS — Z952 Presence of prosthetic heart valve: Secondary | ICD-10-CM

## 2023-05-20 ENCOUNTER — Ambulatory Visit: Payer: Medicare Other | Admitting: Cardiovascular Disease

## 2023-05-26 ENCOUNTER — Ambulatory Visit (INDEPENDENT_AMBULATORY_CARE_PROVIDER_SITE_OTHER): Payer: Medicare Other

## 2023-05-26 DIAGNOSIS — I340 Nonrheumatic mitral (valve) insufficiency: Secondary | ICD-10-CM

## 2023-05-26 DIAGNOSIS — Z952 Presence of prosthetic heart valve: Secondary | ICD-10-CM

## 2023-05-26 DIAGNOSIS — E782 Mixed hyperlipidemia: Secondary | ICD-10-CM

## 2023-05-26 DIAGNOSIS — I10 Essential (primary) hypertension: Secondary | ICD-10-CM

## 2023-05-26 DIAGNOSIS — I351 Nonrheumatic aortic (valve) insufficiency: Secondary | ICD-10-CM

## 2023-05-26 DIAGNOSIS — R002 Palpitations: Secondary | ICD-10-CM

## 2023-05-26 DIAGNOSIS — I350 Nonrheumatic aortic (valve) stenosis: Secondary | ICD-10-CM

## 2023-05-30 DIAGNOSIS — R002 Palpitations: Secondary | ICD-10-CM | POA: Diagnosis not present

## 2023-06-02 ENCOUNTER — Other Ambulatory Visit: Payer: Self-pay | Admitting: Cardiovascular Disease

## 2023-06-02 ENCOUNTER — Telehealth: Payer: Self-pay

## 2023-06-02 MED ORDER — AMOXICILLIN 500 MG PO CAPS: ORAL_CAPSULE | ORAL | 0 refills | Status: DC

## 2023-06-02 NOTE — Telephone Encounter (Signed)
SENT 

## 2023-06-05 ENCOUNTER — Ambulatory Visit: Payer: Medicare Other | Admitting: Cardiovascular Disease

## 2023-06-05 ENCOUNTER — Encounter: Payer: Self-pay | Admitting: Cardiovascular Disease

## 2023-06-05 VITALS — BP 140/80 | HR 56 | Ht 69.0 in | Wt 226.0 lb

## 2023-06-05 DIAGNOSIS — I351 Nonrheumatic aortic (valve) insufficiency: Secondary | ICD-10-CM

## 2023-06-05 DIAGNOSIS — E782 Mixed hyperlipidemia: Secondary | ICD-10-CM | POA: Diagnosis not present

## 2023-06-05 DIAGNOSIS — Z952 Presence of prosthetic heart valve: Secondary | ICD-10-CM

## 2023-06-05 DIAGNOSIS — I10 Essential (primary) hypertension: Secondary | ICD-10-CM | POA: Diagnosis not present

## 2023-06-05 DIAGNOSIS — I350 Nonrheumatic aortic (valve) stenosis: Secondary | ICD-10-CM

## 2023-06-05 NOTE — Progress Notes (Signed)
Cardiology Office Note   Date:  06/05/2023   ID:  Richard [name], DOB [birth year], MRN [id number]  PCP:  Laurier Nancy, MD  Cardiologist:  Adrian Blackwater, MD      History of Present Illness: Richard [name] is a 72 y.o. male who presents for  Chief Complaint  Patient presents with   Follow-up    Holter results    BP at home ok      Past Medical History:  Diagnosis Date   Aortic stenosis    Hyperlipidemia    Hypertension    Mitral valve regurgitation      Past Surgical History:  Procedure Laterality Date   NO PAST SURGERIES     RIGHT/LEFT HEART CATH AND CORONARY ANGIOGRAPHY Bilateral 06/20/2020   Procedure: RIGHT/LEFT HEART CATH AND CORONARY ANGIOGRAPHY;  Surgeon: Laurier Nancy, MD;  Location: [facility];  Service: Cardiovascular;  Laterality: Bilateral;     Current Outpatient Medications  Medication Sig Dispense Refill   amoxicillin (AMOXIL) 500 MG capsule TAKE 2 GRAM (4 CAP) 1 HOUR PRIOR TO ORAL PROCEEDURE 4 capsule 0   aspirin 81 MG chewable tablet Chew by mouth.     Cholecalciferol (VITAMIN D3) 50 MCG (2000 UT) TABS Take 2,000 Units by mouth 3 (three) times a week.     Coenzyme Q10 (CO Q 10) 100 MG CAPS Take 100 mg by mouth daily.     losartan-hydrochlorothiazide (HYZAAR) 100-12.5 MG tablet Take 1 tablet by mouth daily. 90 tablet 2   metoprolol succinate (TOPROL XL) 25 MG 24 hr tablet Take 1 tablet (25 mg total) by mouth daily. 30 tablet 11   metoprolol succinate (TOPROL XL) 50 MG 24 hr tablet Take 1 tablet (50 mg total) by mouth daily. Take with or immediately following a meal. 30 tablet 0   Pitavastatin Calcium (LIVALO) 4 MG TABS Take 1 tablet (4 mg total) by mouth daily. 90 tablet 3   spironolactone (ALDACTONE) 25 MG tablet Take 1 tablet (25 mg total) by mouth daily. 30 tablet 11   No current facility-administered medications for this visit.   Facility-Administered Medications Ordered in Other Visits  Medication Dose Route Frequency  Provider Last Rate Last Admin   sodium chloride flush (NS) 0.9 % injection 3 mL  3 mL Intravenous Q12H Laurier Nancy, MD        Allergies:   Crestor [rosuvastatin] and Zetia [ezetimibe]    Social History:   reports that he has never smoked. He has quit using smokeless tobacco.  His smokeless tobacco use included chew and snuff. He reports current alcohol use. He reports that he does not use drugs.   Family History:  family history includes Colon cancer in his mother; Hyperlipidemia in his mother; Other in his father.    ROS:     Review of Systems  Constitutional: Negative.   HENT: Negative.    Eyes: Negative.   Respiratory: Negative.    Gastrointestinal: Negative.   Genitourinary: Negative.   Musculoskeletal: Negative.   Skin: Negative.   Neurological: Negative.   Endo/Heme/Allergies: Negative.   Psychiatric/Behavioral: Negative.    All other systems reviewed and are negative.     All other systems are reviewed and negative.    PHYSICAL EXAM: VS:  BP (!) 140/80   Pulse (!) 56   Ht 5\' 9"  (1.753 m)   Wt 226 lb (102.5 kg)   SpO2 99%   BMI 33.37 kg/m  , BMI Body mass index  is 33.37 kg/m. Last weight:  Wt Readings from Last 3 Encounters:  06/05/23 226 lb (102.5 kg)  05/19/23 226 lb (102.5 kg)  05/15/23 226 lb 3.2 oz (102.6 kg)     Physical Exam Vitals reviewed.  Constitutional:      Appearance: Normal appearance. He is normal weight.  HENT:     Head: Normocephalic.     Nose: Nose normal.     Mouth/Throat:     Mouth: Mucous membranes are moist.  Eyes:     Pupils: Pupils are equal, round, and reactive to light.  Cardiovascular:     Rate and Rhythm: Normal rate and regular rhythm.     Pulses: Normal pulses.     Heart sounds: Normal heart sounds.  Pulmonary:     Effort: Pulmonary effort is normal.  Abdominal:     General: Abdomen is flat. Bowel sounds are normal.  Musculoskeletal:        General: Normal range of motion.     Cervical back: Normal range of  motion.  Skin:    General: Skin is warm.  Neurological:     General: No focal deficit present.     Mental Status: He is alert.  Psychiatric:        Mood and Affect: Mood normal.       EKG:   Recent Labs: 05/01/2023: ALT 26; TSH 1.560 05/13/2023: BUN 23; Creatinine, Ser 1.12; Hemoglobin 15.9; Magnesium 2.2; Platelets 256; Potassium 4.0; Sodium 135    Lipid Panel    Component Value Date/Time   CHOL 236 (H) 05/01/2023 0901   TRIG 211 (H) 05/01/2023 0901   HDL 38 (L) 05/01/2023 0901   CHOLHDL 6.2 (H) 05/01/2023 0901   CHOLHDL 8.0 05/14/2020 1034   VLDL 37 05/14/2020 1034   LDLCALC 159 (H) 05/01/2023 0901      Other studies Reviewed: Additional studies/ records that were reviewed today include:  Review of the above records demonstrates:       No data to display            ASSESSMENT AND PLAN:    ICD-10-CM   1. History of aortic valve replacement  Z95.2 Lipid Profile    Comprehensive metabolic panel    2. Mixed hyperlipidemia  E78.2 Lipid Profile    Comprehensive metabolic panel   repeat labs as started on livalo    3. Primary hypertension  I10 Lipid Profile    Comprehensive metabolic panel   repeat bp 130/75    4. Severe aortic stenosis  I35.0 Lipid Profile    Comprehensive metabolic panel    5. Aortic valve insufficiency, etiology of cardiac valve disease unspecified  I35.1    mild to moderate       Problem List Items Addressed This Visit       Cardiovascular and Mediastinum   Severe aortic stenosis   Relevant Orders   Lipid Profile   Comprehensive metabolic panel   Primary hypertension   Relevant Orders   Lipid Profile   Comprehensive metabolic panel     Other   History of aortic valve replacement - Primary   Relevant Orders   Lipid Profile   Comprehensive metabolic panel   Mixed hyperlipidemia   Relevant Orders   Lipid Profile   Comprehensive metabolic panel   Other Visit Diagnoses     Aortic valve insufficiency, etiology of  cardiac valve disease unspecified       mild to moderate  Disposition:   Return in about 3 months (around 09/05/2023).    Total time spent: 30 minutes  Signed,  Adrian Blackwater, MD  06/05/2023 9:56 AM    [facility]

## 2023-06-06 LAB — LIPID PANEL
Chol/HDL Ratio: 5.5 {ratio} — ABNORMAL HIGH (ref 0.0–5.0)
Cholesterol, Total: 210 mg/dL — ABNORMAL HIGH (ref 100–199)
HDL: 38 mg/dL — ABNORMAL LOW (ref >39)
LDL Chol Calc (NIH): 138 mg/dL — ABNORMAL HIGH (ref 0–99)
Triglycerides: 189 mg/dL — ABNORMAL HIGH (ref 0–149)
VLDL Cholesterol Cal: 34 mg/dL (ref 5–40)

## 2023-06-06 LAB — COMPREHENSIVE METABOLIC PANEL
ALT: 28 [IU]/L (ref 0–44)
AST: 28 [IU]/L (ref 0–40)
Albumin: 4.5 g/dL (ref 3.9–4.9)
Alkaline Phosphatase: 62 [IU]/L (ref 44–121)
BUN/Creatinine Ratio: 13 (ref 10–24)
BUN: 16 mg/dL (ref 8–27)
Bilirubin Total: 0.4 mg/dL (ref 0.0–1.2)
CO2: 21 mmol/L (ref 20–29)
Calcium: 10.1 mg/dL (ref 8.6–10.2)
Chloride: 100 mmol/L (ref 96–106)
Creatinine, Ser: 1.25 mg/dL (ref 0.76–1.27)
Globulin, Total: 2.7 g/dL (ref 1.5–4.5)
Glucose: 99 mg/dL (ref 70–99)
Potassium: 4.6 mmol/L (ref 3.5–5.2)
Sodium: 137 mmol/L (ref 134–144)
Total Protein: 7.2 g/dL (ref 6.0–8.5)
eGFR: 62 mL/min/{1.73_m2} (ref >59)

## 2023-08-05 ENCOUNTER — Other Ambulatory Visit: Payer: Self-pay | Admitting: Cardiovascular Disease

## 2023-08-05 DIAGNOSIS — I10 Essential (primary) hypertension: Secondary | ICD-10-CM

## 2023-09-05 ENCOUNTER — Other Ambulatory Visit: Payer: Self-pay | Admitting: Cardiovascular Disease

## 2023-09-05 ENCOUNTER — Ambulatory Visit (INDEPENDENT_AMBULATORY_CARE_PROVIDER_SITE_OTHER): Payer: Medicare Other | Admitting: Cardiovascular Disease

## 2023-09-05 ENCOUNTER — Encounter: Payer: Self-pay | Admitting: Cardiovascular Disease

## 2023-09-05 VITALS — BP 161/92 | HR 80 | Ht 69.0 in | Wt 228.0 lb

## 2023-09-05 DIAGNOSIS — Z952 Presence of prosthetic heart valve: Secondary | ICD-10-CM

## 2023-09-05 DIAGNOSIS — I350 Nonrheumatic aortic (valve) stenosis: Secondary | ICD-10-CM | POA: Diagnosis not present

## 2023-09-05 DIAGNOSIS — I10 Essential (primary) hypertension: Secondary | ICD-10-CM | POA: Diagnosis not present

## 2023-09-05 DIAGNOSIS — E782 Mixed hyperlipidemia: Secondary | ICD-10-CM

## 2023-09-05 MED ORDER — NEXLETOL 180 MG PO TABS
ORAL_TABLET | ORAL | 3 refills | Status: DC
Start: 2023-09-05 — End: 2023-12-05

## 2023-09-05 MED ORDER — AMOXICILLIN 500 MG PO CAPS
ORAL_CAPSULE | ORAL | 2 refills | Status: DC
Start: 2023-09-05 — End: 2023-12-22

## 2023-09-05 NOTE — Progress Notes (Signed)
 Cardiology Office Note   Date:  09/05/2023   ID:  Richard [name], DOB May 17, 1951, MRN [id number]  PCP:  Fernand Denyse LABOR, MD  Cardiologist:  Denyse Fernand, MD      History of Present Illness: Richard [name] is a 73 y.o. male who presents for  Chief Complaint  Patient presents with   Follow-up    Stopped statins for 3 weeks as thinks caused GERD.                                                                                                                                                                                                                            Past Medical History:  Diagnosis Date   Aortic stenosis    Hyperlipidemia    Hypertension    Mitral valve regurgitation      Past Surgical History:  Procedure Laterality Date   NO PAST SURGERIES     RIGHT/LEFT HEART CATH AND CORONARY ANGIOGRAPHY Bilateral 06/20/2020   Procedure: RIGHT/LEFT HEART CATH AND CORONARY ANGIOGRAPHY;  Surgeon: Fernand Denyse LABOR, MD;  Location: [facility];  Service: Cardiovascular;  Laterality: Bilateral;     Current Outpatient Medications  Medication Sig Dispense Refill   amoxicillin  (AMOXIL ) 500 MG capsule Take 4 tab 1 hour prior to dental work 4 capsule 2   Bempedoic Acid  (NEXLETOL ) 180 MG TABS Take 1 tab daily 30 tablet 3   aspirin  81 MG chewable tablet Chew by mouth.     Cholecalciferol (VITAMIN D3) 50 MCG (2000 UT) TABS Take 2,000 Units by mouth 3 (three) times a week.     Coenzyme Q10 (CO Q 10) 100 MG CAPS Take 100 mg by mouth daily.     losartan -hydrochlorothiazide (HYZAAR) 100-12.5 MG tablet Take 1 tablet by mouth daily. 90 tablet 2   metoprolol  succinate (TOPROL  XL) 25 MG 24 hr tablet Take 1 tablet (25 mg total) by mouth daily. 30 tablet 11   metoprolol  succinate (TOPROL  XL) 50 MG 24 hr tablet Take 1 tablet (50 mg total) by mouth daily. Take with or immediately following a meal. 30 tablet 0   Pitavastatin  Calcium  (LIVALO ) 4 MG TABS Take 1 tablet (4 mg total)  by mouth daily. (Patient not taking: Reported on 09/05/2023) 90 tablet 3   spironolactone  (ALDACTONE ) 25 MG tablet Take 1 tablet (25 mg total) by mouth daily. 30 tablet 11   No current facility-administered medications for this visit.   Facility-Administered  Medications Ordered in Other Visits  Medication Dose Route Frequency Provider Last Rate Last Admin   sodium chloride  flush (NS) 0.9 % injection 3 mL  3 mL Intravenous Q12H Fernand Denyse LABOR, MD        Allergies:   Crestor  [rosuvastatin ] and Zetia [ezetimibe]    Social History:   reports that he has never smoked. He has quit using smokeless tobacco.  His smokeless tobacco use included chew and snuff. He reports current alcohol use. He reports that he does not use drugs.   Family History:  family history includes Colon cancer in his mother; Hyperlipidemia in his mother; Other in his father.    ROS:     Review of Systems  Constitutional: Negative.   HENT: Negative.    Eyes: Negative.   Respiratory: Negative.    Gastrointestinal: Negative.   Genitourinary: Negative.   Musculoskeletal: Negative.   Skin: Negative.   Neurological: Negative.   Endo/Heme/Allergies: Negative.   Psychiatric/Behavioral: Negative.    All other systems reviewed and are negative.     All other systems are reviewed and negative.    PHYSICAL EXAM: VS:  BP (!) 161/92   Pulse 80   Ht 5' 9 (1.753 m)   Wt 228 lb (103.4 kg)   SpO2 99%   BMI 33.67 kg/m  , BMI Body mass index is 33.67 kg/m. Last weight:  Wt Readings from Last 3 Encounters:  09/05/23 228 lb (103.4 kg)  06/05/23 226 lb (102.5 kg)  05/19/23 226 lb (102.5 kg)     Physical Exam Vitals reviewed.  Constitutional:      Appearance: Normal appearance. He is normal weight.  HENT:     Head: Normocephalic.     Nose: Nose normal.     Mouth/Throat:     Mouth: Mucous membranes are moist.  Eyes:     Pupils: Pupils are equal, round, and reactive to light.  Cardiovascular:     Rate and  Rhythm: Normal rate and regular rhythm.     Pulses: Normal pulses.     Heart sounds: Normal heart sounds.  Pulmonary:     Effort: Pulmonary effort is normal.  Abdominal:     General: Abdomen is flat. Bowel sounds are normal.  Musculoskeletal:        General: Normal range of motion.     Cervical back: Normal range of motion.  Skin:    General: Skin is warm.  Neurological:     General: No focal deficit present.     Mental Status: He is alert.  Psychiatric:        Mood and Affect: Mood normal.       EKG:   Recent Labs: 05/01/2023: TSH 1.560 05/13/2023: Hemoglobin 15.9; Magnesium 2.2; Platelets 256 06/05/2023: ALT 28; BUN 16; Creatinine, Ser 1.25; Potassium 4.6; Sodium 137    Lipid Panel    Component Value Date/Time   CHOL 210 (H) 06/05/2023 1030   TRIG 189 (H) 06/05/2023 1030   HDL 38 (L) 06/05/2023 1030   CHOLHDL 5.5 (H) 06/05/2023 1030   CHOLHDL 8.0 05/14/2020 1034   VLDL 37 05/14/2020 1034   LDLCALC 138 (H) 06/05/2023 1030      Other studies Reviewed: Additional studies/ records that were reviewed today include:  Review of the above records demonstrates:       No data to display            ASSESSMENT AND PLAN:    ICD-10-CM   1. Severe aortic stenosis  I35.0 Bempedoic Acid  (NEXLETOL ) 180 MG TABS    amoxicillin  (AMOXIL ) 500 MG capsule    2. Primary hypertension  I10 Bempedoic Acid  (NEXLETOL ) 180 MG TABS    amoxicillin  (AMOXIL ) 500 MG capsule   chang hydralzine 50 bid    3. Mixed hyperlipidemia  E78.2 Bempedoic Acid  (NEXLETOL ) 180 MG TABS    amoxicillin  (AMOXIL ) 500 MG capsule   LDL 189, T.CHol 329, off livalo  due to SE GERD, and coul not take any other statins. Will place onnexletol 180    4. History of aortic valve replacement  Z95.2 Bempedoic Acid  (NEXLETOL ) 180 MG TABS    amoxicillin  (AMOXIL ) 500 MG capsule       Problem List Items Addressed This Visit       Cardiovascular and Mediastinum   Severe aortic stenosis - Primary   Relevant  Medications   Bempedoic Acid  (NEXLETOL ) 180 MG TABS   amoxicillin  (AMOXIL ) 500 MG capsule   Primary hypertension   Relevant Medications   Bempedoic Acid  (NEXLETOL ) 180 MG TABS   amoxicillin  (AMOXIL ) 500 MG capsule     Other   History of aortic valve replacement   Relevant Medications   Bempedoic Acid  (NEXLETOL ) 180 MG TABS   amoxicillin  (AMOXIL ) 500 MG capsule   Mixed hyperlipidemia   Relevant Medications   Bempedoic Acid  (NEXLETOL ) 180 MG TABS   amoxicillin  (AMOXIL ) 500 MG capsule       Disposition:   Return in about 3 months (around 12/04/2023).    Total time spent: 50  minutes  Signed,  Denyse Bathe, MD  09/05/2023 10:04 AM    [facility]

## 2023-12-05 ENCOUNTER — Encounter: Payer: Self-pay | Admitting: Cardiovascular Disease

## 2023-12-05 ENCOUNTER — Ambulatory Visit: Payer: Medicare Other | Admitting: Cardiovascular Disease

## 2023-12-05 VITALS — BP 124/72 | HR 75 | Ht 69.0 in | Wt 225.0 lb

## 2023-12-05 DIAGNOSIS — I350 Nonrheumatic aortic (valve) stenosis: Secondary | ICD-10-CM

## 2023-12-05 DIAGNOSIS — E782 Mixed hyperlipidemia: Secondary | ICD-10-CM | POA: Diagnosis not present

## 2023-12-05 DIAGNOSIS — I10 Essential (primary) hypertension: Secondary | ICD-10-CM

## 2023-12-05 DIAGNOSIS — R002 Palpitations: Secondary | ICD-10-CM | POA: Diagnosis not present

## 2023-12-05 DIAGNOSIS — Z952 Presence of prosthetic heart valve: Secondary | ICD-10-CM

## 2023-12-05 MED ORDER — METOPROLOL SUCCINATE ER 50 MG PO TB24
50.0000 mg | ORAL_TABLET | Freq: Every day | ORAL | 0 refills | Status: DC
Start: 2023-12-05 — End: 2023-12-29

## 2023-12-05 MED ORDER — NEXLETOL 180 MG PO TABS
ORAL_TABLET | ORAL | 3 refills | Status: DC
Start: 2023-12-05 — End: 2023-12-22

## 2023-12-05 NOTE — Progress Notes (Signed)
 Cardiology Office Note   Date:  12/05/2023   ID:  Richard [name], DOB [birth year], MRN [id number]  PCP:  Laurier Nancy, MD  Cardiologist:  Adrian Blackwater, MD      History of Present Illness: Richard [name] is a 73 y.o. male who presents for  Chief Complaint  Patient presents with   Follow-up    3 month follow up     Has some abdominal pains and gas.      Past Medical History:  Diagnosis Date   Aortic stenosis    Hyperlipidemia    Hypertension    Mitral valve regurgitation      Past Surgical History:  Procedure Laterality Date   NO PAST SURGERIES     RIGHT/LEFT HEART CATH AND CORONARY ANGIOGRAPHY Bilateral 06/20/2020   Procedure: RIGHT/LEFT HEART CATH AND CORONARY ANGIOGRAPHY;  Surgeon: Laurier Nancy, MD;  Location: [facility] INVASIVE CV LAB;  Service: Cardiovascular;  Laterality: Bilateral;     Current Outpatient Medications  Medication Sig Dispense Refill   amoxicillin (AMOXIL) 500 MG capsule Take 4 tab 1 hour prior to dental work 4 capsule 2   aspirin 81 MG chewable tablet Chew by mouth.     Bempedoic Acid (NEXLETOL) 180 MG TABS Take 1 tab daily 30 tablet 3   Cholecalciferol (VITAMIN D3) 50 MCG (2000 UT) TABS Take 2,000 Units by mouth 3 (three) times a week.     Coenzyme Q10 (CO Q 10) 100 MG CAPS Take 100 mg by mouth daily.     losartan-hydrochlorothiazide (HYZAAR) 100-12.5 MG tablet Take 1 tablet by mouth daily. 90 tablet 2   metoprolol succinate (TOPROL XL) 50 MG 24 hr tablet Take 1 tablet (50 mg total) by mouth daily. Take with or immediately following a meal. 30 tablet 0   spironolactone (ALDACTONE) 25 MG tablet Take 1 tablet (25 mg total) by mouth daily. 30 tablet 11   No current facility-administered medications for this visit.   Facility-Administered Medications Ordered in Other Visits  Medication Dose Route Frequency Provider Last Rate Last Admin   sodium chloride flush (NS) 0.9 % injection 3 mL  3 mL Intravenous Q12H Laurier Nancy, MD         Allergies:   Crestor [rosuvastatin] and Zetia [ezetimibe]    Social History:   reports that he has never smoked. He has quit using smokeless tobacco.  His smokeless tobacco use included chew and snuff. He reports current alcohol use. He reports that he does not use drugs.   Family History:  family history includes Colon cancer in his mother; Hyperlipidemia in his mother; Other in his father.    ROS:     Review of Systems  Constitutional: Negative.   HENT: Negative.    Eyes: Negative.   Respiratory: Negative.    Gastrointestinal: Negative.   Genitourinary: Negative.   Musculoskeletal: Negative.   Skin: Negative.   Neurological: Negative.   Endo/Heme/Allergies: Negative.   Psychiatric/Behavioral: Negative.    All other systems reviewed and are negative.     All other systems are reviewed and negative.    PHYSICAL EXAM: VS:  BP 124/72   Pulse 75   Ht 5\' 9"  (1.753 m)   Wt 225 lb (102.1 kg)   SpO2 98%   BMI 33.23 kg/m  , BMI Body mass index is 33.23 kg/m. Last weight:  Wt Readings from Last 3 Encounters:  12/05/23 225 lb (102.1 kg)  09/05/23 228 lb (103.4 kg)  06/05/23 226  lb (102.5 kg)     Physical Exam Vitals reviewed.  Constitutional:      Appearance: Normal appearance. He is normal weight.  HENT:     Head: Normocephalic.     Nose: Nose normal.     Mouth/Throat:     Mouth: Mucous membranes are moist.  Eyes:     Pupils: Pupils are equal, round, and reactive to light.  Cardiovascular:     Rate and Rhythm: Normal rate and regular rhythm.     Pulses: Normal pulses.     Heart sounds: Normal heart sounds.  Pulmonary:     Effort: Pulmonary effort is normal.  Abdominal:     General: Abdomen is flat. Bowel sounds are normal.  Musculoskeletal:        General: Normal range of motion.     Cervical back: Normal range of motion.  Skin:    General: Skin is warm.  Neurological:     General: No focal deficit present.     Mental Status: He is alert.   Psychiatric:        Mood and Affect: Mood normal.       EKG:   Recent Labs: 05/01/2023: TSH 1.560 05/13/2023: Hemoglobin 15.9; Magnesium 2.2; Platelets 256 06/05/2023: ALT 28; BUN 16; Creatinine, Ser 1.25; Potassium 4.6; Sodium 137    Lipid Panel    Component Value Date/Time   CHOL 210 (H) 06/05/2023 1030   TRIG 189 (H) 06/05/2023 1030   HDL 38 (L) 06/05/2023 1030   CHOLHDL 5.5 (H) 06/05/2023 1030   CHOLHDL 8.0 05/14/2020 1034   VLDL 37 05/14/2020 1034   LDLCALC 138 (H) 06/05/2023 1030      Other studies Reviewed: Additional studies/ records that were reviewed today include:  Review of the above records demonstrates:       No data to display            ASSESSMENT AND PLAN:    ICD-10-CM   1. Palpitation  R00.2    has palpitation so go to 50 metoprolol    2. Severe aortic stenosis  I35.0 Bempedoic Acid (NEXLETOL) 180 MG TABS    metoprolol succinate (TOPROL XL) 50 MG 24 hr tablet   s/p AVR    3. Primary hypertension  I10 Bempedoic Acid (NEXLETOL) 180 MG TABS    metoprolol succinate (TOPROL XL) 50 MG 24 hr tablet   chang hydralzine 50 bid    4. Mixed hyperlipidemia  E78.2 Bempedoic Acid (NEXLETOL) 180 MG TABS    metoprolol succinate (TOPROL XL) 50 MG 24 hr tablet   LDL 189, T.CHol 329, off livalo due to SE GERD, and coul not take any other statins. Will place onnexletol 180. Still not taking nexletol as $400, but will get    5. History of aortic valve replacement  Z95.2 Bempedoic Acid (NEXLETOL) 180 MG TABS    metoprolol succinate (TOPROL XL) 50 MG 24 hr tablet       Problem List Items Addressed This Visit       Cardiovascular and Mediastinum   Severe aortic stenosis   Relevant Medications   Bempedoic Acid (NEXLETOL) 180 MG TABS   metoprolol succinate (TOPROL XL) 50 MG 24 hr tablet   Primary hypertension   Relevant Medications   Bempedoic Acid (NEXLETOL) 180 MG TABS   metoprolol succinate (TOPROL XL) 50 MG 24 hr tablet     Other   History of  aortic valve replacement   Relevant Medications   Bempedoic Acid (NEXLETOL) 180 MG  TABS   metoprolol succinate (TOPROL XL) 50 MG 24 hr tablet   Mixed hyperlipidemia   Relevant Medications   Bempedoic Acid (NEXLETOL) 180 MG TABS   metoprolol succinate (TOPROL XL) 50 MG 24 hr tablet   Other Visit Diagnoses       Palpitation    -  Primary   has palpitation so go to 50 metoprolol          Disposition:   Return in about 3 months (around 03/05/2024).    Total time spent: 30 minutes  Signed,  Adrian Blackwater, MD  12/05/2023 9:36 AM    [facility]

## 2023-12-22 ENCOUNTER — Ambulatory Visit: Admitting: Physician Assistant

## 2023-12-22 ENCOUNTER — Encounter: Payer: Self-pay | Admitting: Physician Assistant

## 2023-12-22 VITALS — BP 126/70 | HR 75 | Temp 98.2°F | Ht 69.0 in | Wt 226.0 lb

## 2023-12-22 DIAGNOSIS — K4090 Unilateral inguinal hernia, without obstruction or gangrene, not specified as recurrent: Secondary | ICD-10-CM | POA: Insufficient documentation

## 2023-12-22 DIAGNOSIS — I493 Ventricular premature depolarization: Secondary | ICD-10-CM | POA: Insufficient documentation

## 2023-12-22 DIAGNOSIS — E782 Mixed hyperlipidemia: Secondary | ICD-10-CM | POA: Diagnosis not present

## 2023-12-22 DIAGNOSIS — Z1211 Encounter for screening for malignant neoplasm of colon: Secondary | ICD-10-CM

## 2023-12-22 DIAGNOSIS — R109 Unspecified abdominal pain: Secondary | ICD-10-CM

## 2023-12-22 DIAGNOSIS — Z952 Presence of prosthetic heart valve: Secondary | ICD-10-CM

## 2023-12-22 DIAGNOSIS — I10 Essential (primary) hypertension: Secondary | ICD-10-CM | POA: Diagnosis not present

## 2023-12-22 DIAGNOSIS — K5730 Diverticulosis of large intestine without perforation or abscess without bleeding: Secondary | ICD-10-CM | POA: Insufficient documentation

## 2023-12-22 DIAGNOSIS — N323 Diverticulum of bladder: Secondary | ICD-10-CM | POA: Insufficient documentation

## 2023-12-22 MED ORDER — AMOXICILLIN 500 MG PO CAPS: 2000.0000 mg | ORAL_CAPSULE | ORAL | 4 refills | Status: AC

## 2023-12-22 NOTE — Assessment & Plan Note (Signed)
 Multiple possible etiologies for this including findings on CT abdomen pelvis from November 2021, which I have reviewed.  He has diverticulosis of the colon, also has a bladder diverticulum, also has a small left-sided inguinal hernia per that report.  There is nothing acute about his presentation today, but we might consider repeat CT abdomen/pelvis in the future to evaluate the status of these previously noted findings.  Will be checking some lab work for him, but ultimately he requires GI referral especially in the context of never having received a colonoscopy.  He verbalizes understanding.

## 2023-12-22 NOTE — Progress Notes (Signed)
 Date:  12/22/2023   Name:  Richard [name]   DOB:  1951/02/06   MRN:  [id number]   Chief Complaint: Establish Care, Abdominal Pain (Had a CT scan state he had a small inguinal hernia in dec-2024), and Groin Pain  Abdominal Pain This is a recurrent problem. Episode onset: X5- 6 months. Episode frequency: comes and goes. The problem has been unchanged. The pain is located in the LLQ. The pain is at a severity of 1/10. The pain is mild. The quality of the pain is aching. The abdominal pain does not radiate. Associated symptoms include belching and flatus. Nothing aggravates the pain. He has tried antacids for the symptoms. His past medical history is significant for GERD.  Groin Pain Episode onset: X5 - 6 months. Episode frequency: comes and goes. The problem has been unchanged. The pain is mild. Associated symptoms include abdominal pain. Nothing aggravates the symptoms. He is not sexually active.   Cirilo is a pleasant 73 year old male with a history of HTN, HLD, statin intolerance, severe aortic stenosis s/p TAVR who presents to the clinic today to establish care.  He is closely followed by cardiology Dr. Debborah Fairly every 3 months with most recent visit 12/05/2023.  Today he presents for evaluation of GI symptoms including excess gas, bloating, and abdominal pain which have been occurring for about 6 months.  He has not seen a GI specialist for these concerns and has never had a colonoscopy. He thinks he had Cologuard done many years ago. He has been lost to primary care for years now.   Requests that I take over rx for amoxicillin  prophylaxis, has relatively frequent dental procedures and says his cardiologist has been urging him to get a PCP.    Medication list has been reviewed and updated.  Current Meds  Medication Sig   Cholecalciferol (VITAMIN D3) 50 MCG (2000 UT) TABS Take 2,000 Units by mouth 3 (three) times a week.   Coenzyme Q10 (CO Q 10) 100 MG CAPS Take 100 mg by mouth daily.    losartan -hydrochlorothiazide (HYZAAR) 100-12.5 MG tablet Take 1 tablet by mouth daily.   metoprolol  succinate (TOPROL  XL) 50 MG 24 hr tablet Take 1 tablet (50 mg total) by mouth daily. Take with or immediately following a meal. (Patient taking differently: Take by mouth daily. Take with or immediately following a meal.)   spironolactone  (ALDACTONE ) 25 MG tablet Take 1 tablet (25 mg total) by mouth daily.     Review of Systems  Gastrointestinal:  Positive for abdominal pain and flatus.    Patient Active Problem List   Diagnosis Date Noted   PVC's (premature ventricular contractions) 12/22/2023   Diverticulosis of colon 12/22/2023   Inguinal hernia, left 12/22/2023   Abdominal pain 12/22/2023   Bladder diverticulum 12/22/2023   Primary hypertension 12/02/2022   Mixed hyperlipidemia 12/02/2022   History of aortic valve replacement 04/22/2022   Severe aortic stenosis 06/15/2020    Allergies  Allergen Reactions   Crestor [Rosuvastatin] Diarrhea   Zetia [Ezetimibe] Photosensitivity and Rash    Immunization History  Administered Date(s) Administered   Tdap 02/22/2015    Past Surgical History:  Procedure Laterality Date   CARDIAC VALVE REPLACEMENT  08/01/2020   TAVR   NO PAST SURGERIES     RIGHT/LEFT HEART CATH AND CORONARY ANGIOGRAPHY Bilateral 06/20/2020   Procedure: RIGHT/LEFT HEART CATH AND CORONARY ANGIOGRAPHY;  Surgeon: Cherrie Cornwall, MD;  Location: [facility] INVASIVE CV LAB;  Service: Cardiovascular;  Laterality: Bilateral;  Social History   Tobacco Use   Smoking status: Never   Smokeless tobacco: Former    Types: Chew, Snuff    Quit date: 2021  Vaping Use   Vaping status: Never Used  Substance Use Topics   Alcohol use: Yes    Alcohol/week: 1.0 standard drink of alcohol    Types: 1 Cans of beer per week    Comment: occassional   Drug use: Never    Family History  Problem Relation Age of Onset   Colon cancer Mother    Hyperlipidemia Mother    Rectal  cancer Mother    Other Father        unknown medical istory        12/22/2023    1:24 PM  GAD 7 : Generalized Anxiety Score  Nervous, Anxious, on Edge 0  Control/stop worrying 0  Worry too much - different things 0  Trouble relaxing 0  Restless 0  Easily annoyed or irritable 0  Afraid - awful might happen 0  Total GAD 7 Score 0  Anxiety Difficulty Not difficult at all       12/22/2023    1:24 PM 02/13/2021    7:57 AM 11/16/2020    9:16 AM  Depression screen PHQ 2/9  Decreased Interest 0 0 0  Down, Depressed, Hopeless 0 0 0  PHQ - 2 Score 0 0 0  Altered sleeping  0 0  Tired, decreased energy  0 1  Change in appetite  0 0  Feeling bad or failure about yourself   0 0  Trouble concentrating  0 0  Moving slowly or fidgety/restless  0 0  Suicidal thoughts  0 0  PHQ-9 Score  0 1  Difficult doing work/chores  Not difficult at all Not difficult at all    BP Readings from Last 3 Encounters:  12/22/23 126/70  12/05/23 124/72  09/05/23 (!) 161/92    Wt Readings from Last 3 Encounters:  12/22/23 226 lb (102.5 kg)  12/05/23 225 lb (102.1 kg)  09/05/23 228 lb (103.4 kg)    BP 126/70 (BP Location: Left Arm, Cuff Size: Large)   Pulse 75   Temp 98.2 F (36.8 C)   Ht 5\' 9"  (1.753 m)   Wt 226 lb (102.5 kg)   SpO2 96%   BMI 33.37 kg/m   Physical Exam Vitals and nursing note reviewed.  Constitutional:      Appearance: Normal appearance.  Cardiovascular:     Rate and Rhythm: Normal rate and regular rhythm.     Heart sounds: Murmur heard.     Systolic murmur is present with a grade of 3/6.     No friction rub. No gallop.  Pulmonary:     Effort: Pulmonary effort is normal.     Breath sounds: Normal breath sounds.  Abdominal:     General: There is no distension.     Tenderness: There is abdominal tenderness (left pelvic area).  Genitourinary:   Musculoskeletal:        General: Normal range of motion.  Skin:    General: Skin is warm and dry.  Neurological:      Mental Status: He is alert and oriented to person, place, and time.     Gait: Gait is intact.  Psychiatric:        Mood and Affect: Mood and affect normal.     Recent Labs     Component Value Date/Time   NA 137 06/05/2023 1111   K  4.6 06/05/2023 1111   CL 100 06/05/2023 1111   CO2 21 06/05/2023 1111   GLUCOSE 99 06/05/2023 1111   GLUCOSE 80 05/13/2023 1627   BUN 16 06/05/2023 1111   CREATININE 1.25 06/05/2023 1111   CALCIUM  10.1 06/05/2023 1111   PROT 7.2 06/05/2023 1111   ALBUMIN 4.5 06/05/2023 1111   AST 28 06/05/2023 1111   ALT 28 06/05/2023 1111   ALKPHOS 62 06/05/2023 1111   BILITOT 0.4 06/05/2023 1111   GFRNONAA >60 05/13/2023 1627   GFRAA >60 05/14/2020 1034    Lab Results  Component Value Date   WBC 9.7 05/13/2023   HGB 15.9 05/13/2023   HCT 44.8 05/13/2023   MCV 89.2 05/13/2023   PLT 256 05/13/2023   Lab Results  Component Value Date   HGBA1C 6.0 (H) 05/01/2023   Lab Results  Component Value Date   CHOL 210 (H) 06/05/2023   HDL 38 (L) 06/05/2023   LDLCALC 138 (H) 06/05/2023   TRIG 189 (H) 06/05/2023   CHOLHDL 5.5 (H) 06/05/2023   Lab Results  Component Value Date   TSH 1.560 05/01/2023     Assessment and Plan:  Abdominal pain, unspecified abdominal location Assessment & Plan: Multiple possible etiologies for this including findings on CT abdomen pelvis from November 2021, which I have reviewed.  He has diverticulosis of the colon, also has a bladder diverticulum, also has a small left-sided inguinal hernia per that report.  There is nothing acute about his presentation today, but we might consider repeat CT abdomen/pelvis in the future to evaluate the status of these previously noted findings.  Will be checking some lab work for him, but ultimately he requires GI referral especially in the context of never having received a colonoscopy.  He verbalizes understanding.  Orders: -     CBC with Differential/Platelet -     Comprehensive metabolic  panel with GFR -     Magnesium -     Alpha-Gal Panel -     Food Allergy Profile -     Ambulatory referral to Gastroenterology  Screening for colon cancer -     Ambulatory referral to Gastroenterology  Primary hypertension Assessment & Plan: Well-controlled with current regimen, continue at the discretion of cardiology.  Continue home BP monitoring.  Orders: -     CBC with Differential/Platelet -     Comprehensive metabolic panel with GFR -     Magnesium  Mixed hyperlipidemia Assessment & Plan: Check fasting lipids along with the other labs we are checking today.  Patient will return at a later date so he can be fasting.  Orders: -     Lipid panel  History of aortic valve replacement Assessment & Plan: Taking over amoxicillin  prophylaxis.  Orders: -     Amoxicillin ; Take 4 capsules (2,000 mg total) by mouth as directed. Take 4 tab 1 hour prior to dental work  Dispense: 4 capsule; Refill: 4     Return in about 3 months (around 03/22/2024) for OV f/u chronic conditions.    Cody Das, PA-C, DMSc, Nutritionist [facility] [phone]

## 2023-12-22 NOTE — Assessment & Plan Note (Signed)
 Well-controlled with current regimen, continue at the discretion of cardiology.  Continue home BP monitoring.

## 2023-12-22 NOTE — Assessment & Plan Note (Signed)
 Taking over amoxicillin  prophylaxis.

## 2023-12-22 NOTE — Assessment & Plan Note (Signed)
 Check fasting lipids along with the other labs we are checking today.  Patient will return at a later date so he can be fasting.

## 2023-12-27 LAB — CBC WITH DIFFERENTIAL/PLATELET
Basophils Absolute: 0 10*3/uL (ref 0.0–0.2)
Basos: 1 %
EOS (ABSOLUTE): 0.1 10*3/uL (ref 0.0–0.4)
Eos: 2 %
Hematocrit: 46.1 % (ref 37.5–51.0)
Hemoglobin: 15.5 g/dL (ref 13.0–17.7)
Immature Grans (Abs): 0 10*3/uL (ref 0.0–0.1)
Immature Granulocytes: 0 %
Lymphocytes Absolute: 2 10*3/uL (ref 0.7–3.1)
Lymphs: 34 %
MCH: 31.2 pg (ref 26.6–33.0)
MCHC: 33.6 g/dL (ref 31.5–35.7)
MCV: 93 fL (ref 79–97)
Monocytes Absolute: 0.6 10*3/uL (ref 0.1–0.9)
Monocytes: 9 %
Neutrophils Absolute: 3.2 10*3/uL (ref 1.4–7.0)
Neutrophils: 54 %
Platelets: 250 10*3/uL (ref 150–450)
RBC: 4.97 x10E6/uL (ref 4.14–5.80)
RDW: 13.2 % (ref 11.6–15.4)
WBC: 6 10*3/uL (ref 3.4–10.8)

## 2023-12-27 LAB — FOOD ALLERGY PROFILE
Allergen Corn, IgE: <0.1 kU/L
Clam IgE: <0.1 kU/L
Codfish IgE: <0.1 kU/L
Egg White IgE: <0.1 kU/L
Milk IgE: 0.14 kU/L — AB
Peanut IgE: <0.1 kU/L
Scallop IgE: <0.1 kU/L
Sesame Seed IgE: <0.1 kU/L
Shrimp IgE: <0.1 kU/L
Soybean IgE: <0.1 kU/L
Walnut IgE: <0.1 kU/L
Wheat IgE: <0.1 kU/L

## 2023-12-27 LAB — LIPID PANEL
Chol/HDL Ratio: 9.3 ratio — ABNORMAL HIGH (ref 0.0–5.0)
Cholesterol, Total: 327 mg/dL — ABNORMAL HIGH (ref 100–199)
HDL: 35 mg/dL — ABNORMAL LOW (ref >39)
LDL Chol Calc (NIH): 240 mg/dL — ABNORMAL HIGH (ref 0–99)
Triglycerides: 251 mg/dL — ABNORMAL HIGH (ref 0–149)
VLDL Cholesterol Cal: 52 mg/dL — ABNORMAL HIGH (ref 5–40)

## 2023-12-27 LAB — COMPREHENSIVE METABOLIC PANEL WITH GFR
ALT: 22 IU/L (ref 0–44)
AST: 23 IU/L (ref 0–40)
Albumin: 4.2 g/dL (ref 3.9–4.9)
Alkaline Phosphatase: 56 IU/L (ref 44–121)
BUN/Creatinine Ratio: 13 (ref 10–24)
BUN: 18 mg/dL (ref 8–27)
Bilirubin Total: 0.4 mg/dL (ref 0.0–1.2)
CO2: 21 mmol/L (ref 20–29)
Calcium: 9.5 mg/dL (ref 8.6–10.2)
Chloride: 98 mmol/L (ref 96–106)
Creatinine, Ser: 1.36 mg/dL — ABNORMAL HIGH (ref 0.76–1.27)
Globulin, Total: 2.8 g/dL (ref 1.5–4.5)
Glucose: 104 mg/dL — ABNORMAL HIGH (ref 70–99)
Potassium: 4.5 mmol/L (ref 3.5–5.2)
Sodium: 134 mmol/L (ref 134–144)
Total Protein: 7 g/dL (ref 6.0–8.5)
eGFR: 56 mL/min/{1.73_m2} — ABNORMAL LOW (ref >59)

## 2023-12-27 LAB — ALPHA-GAL PANEL
Allergen Lamb IgE: 0.55 kU/L — AB
Beef IgE: 2.3 kU/L — AB
IgE (Immunoglobulin E), Serum: 138 [IU]/mL (ref 6–495)
O215-IgE Alpha-Gal: 5.11 kU/L — AB
Pork IgE: 0.55 kU/L — AB

## 2023-12-27 LAB — MAGNESIUM: Magnesium: 2.3 mg/dL (ref 1.6–2.3)

## 2023-12-28 ENCOUNTER — Other Ambulatory Visit: Payer: Self-pay | Admitting: Cardiovascular Disease

## 2023-12-28 DIAGNOSIS — I10 Essential (primary) hypertension: Secondary | ICD-10-CM

## 2023-12-28 DIAGNOSIS — E782 Mixed hyperlipidemia: Secondary | ICD-10-CM

## 2023-12-28 DIAGNOSIS — Z952 Presence of prosthetic heart valve: Secondary | ICD-10-CM

## 2023-12-28 DIAGNOSIS — I350 Nonrheumatic aortic (valve) stenosis: Secondary | ICD-10-CM

## 2023-12-29 ENCOUNTER — Encounter: Payer: Self-pay | Admitting: Physician Assistant

## 2023-12-29 ENCOUNTER — Other Ambulatory Visit: Payer: Self-pay | Admitting: Physician Assistant

## 2023-12-29 DIAGNOSIS — Z91018 Allergy to other foods: Secondary | ICD-10-CM | POA: Insufficient documentation

## 2023-12-29 DIAGNOSIS — Z91014 Allergy to mammalian meats: Secondary | ICD-10-CM | POA: Insufficient documentation

## 2023-12-29 MED ORDER — ROSUVASTATIN CALCIUM 5 MG PO TABS: 5.0000 mg | ORAL_TABLET | Freq: Every day | ORAL | 1 refills | Status: DC

## 2023-12-29 NOTE — Telephone Encounter (Signed)
 Please review.  KP

## 2023-12-29 NOTE — Progress Notes (Signed)
 Documented intolerance to statin (GI side effects) but we recently discovered alpha-gal so he wonders if he misattributed his symptoms to statin. He would like to retry.

## 2024-01-09 ENCOUNTER — Encounter: Payer: Self-pay | Admitting: Cardiovascular Disease

## 2024-01-13 ENCOUNTER — Encounter: Payer: Self-pay | Admitting: Cardiovascular Disease

## 2024-01-13 ENCOUNTER — Ambulatory Visit: Admitting: Cardiovascular Disease

## 2024-01-13 VITALS — BP 148/82 | HR 63 | Ht 69.0 in | Wt 227.0 lb

## 2024-01-13 DIAGNOSIS — E782 Mixed hyperlipidemia: Secondary | ICD-10-CM | POA: Diagnosis not present

## 2024-01-13 DIAGNOSIS — Z952 Presence of prosthetic heart valve: Secondary | ICD-10-CM | POA: Diagnosis not present

## 2024-01-13 DIAGNOSIS — I350 Nonrheumatic aortic (valve) stenosis: Secondary | ICD-10-CM | POA: Diagnosis not present

## 2024-01-13 DIAGNOSIS — I10 Essential (primary) hypertension: Secondary | ICD-10-CM | POA: Diagnosis not present

## 2024-01-13 MED ORDER — AMLODIPINE BESYLATE 5 MG PO TABS
5.0000 mg | ORAL_TABLET | Freq: Every day | ORAL | 11 refills | Status: AC
Start: 2024-01-13 — End: 2025-01-12

## 2024-01-13 NOTE — Progress Notes (Addendum)
 Cardiology Office Note   Date:  01/13/2024   ID:  Richard [name], DOB 1951/07/27, MRN [id number]  PCP:  Leopoldo Rancher, PA  Cardiologist:  Debborah Fairly, MD      History of Present Illness: Richard [name] is a 73 y.o. male who presents for  Chief Complaint  Patient presents with   Acute Visit    Positive Alpha Gal, Bovine heart valve    Concerned about Aortic valve, wear and tear. No symptoms.      Past Medical History:  Diagnosis Date   Allergy     Aortic stenosis    GERD (gastroesophageal reflux disease) 07/27/2023   Heart murmur 04/26/2020   Hyperlipidemia    Hypertension    Mitral valve regurgitation      Past Surgical History:  Procedure Laterality Date   CARDIAC VALVE REPLACEMENT  08/01/2020   TAVR   NO PAST SURGERIES     RIGHT/LEFT HEART CATH AND CORONARY ANGIOGRAPHY Bilateral 06/20/2020   Procedure: RIGHT/LEFT HEART CATH AND CORONARY ANGIOGRAPHY;  Surgeon: Cherrie Cornwall, MD;  Location: [facility] INV[facility]IVE [facility];  Service: Cardiovascular;  Laterality: Bilateral;     Current Outpatient Medications  Medication Sig Dispense Refill   amLODipine  (NORVASC ) 5 MG tablet Take 1 tablet (5 mg total) by mouth daily. 30 tablet 11   Cholecalciferol (VITAMIN D3) 50 MCG (2000 UT) TABS Take 2,000 Units by mouth 3 (three) times a week.     Coenzyme Q10 (CO Q 10) 100 MG CAPS Take 100 mg by mouth daily.     losartan -hydrochlorothiazide (HYZAAR) 100-12.5 MG tablet Take 1 tablet by mouth daily. 90 tablet 2   metoprolol  succinate (TOPROL -XL) 50 MG 24 hr tablet Take 1 tablet (50 mg total) by mouth daily. Take with or immediately following a meal. 30 tablet 2   rosuvastatin  (CRESTOR ) 5 MG tablet Take 1 tablet (5 mg total) by mouth daily. 30 tablet 1   spironolactone  (ALDACTONE ) 25 MG tablet Take 1 tablet (25 mg total) by mouth daily. 30 tablet 11   amoxicillin  (AMOXIL ) 500 MG capsule Take 4 capsules (2,000 mg total) by mouth as directed. Take 4 tab 1 hour prior to dental  work 4 capsule 4   aspirin  81 MG chewable tablet Chew by mouth. (Patient not taking: Reported on 12/22/2023)     No current facility-administered medications for this visit.    Allergies:   Alpha-gal, Glycerin, Heparin  (bovine), Crestor  [rosuvastatin ], and Zetia [ezetimibe]    Social History:   reports that he has never smoked. He quit smokeless tobacco use about 4 years ago.  His smokeless tobacco use included chew and snuff. He reports current alcohol use of about 1.0 standard drink of alcohol per week. He reports that he does not use drugs.   Family History:  family history includes Colon cancer in his mother; Hyperlipidemia in his mother; Other in his father; Rectal cancer in his mother.    ROS:     Review of Systems  Constitutional: Negative.   HENT: Negative.    Eyes: Negative.   Respiratory: Negative.    Gastrointestinal: Negative.   Genitourinary: Negative.   Musculoskeletal: Negative.   Skin: Negative.   Neurological: Negative.   Endo/Heme/Allergies: Negative.   Psychiatric/Behavioral: Negative.    All other systems reviewed and are negative.     All other systems are reviewed and negative.    PHYSICAL EXAM: VS:  BP (!) 148/82   Pulse 63   Ht 5\' 9"  (1.753 m)  Wt 227 lb (103 kg)   SpO2 98%   BMI 33.52 kg/m  , BMI Body mass index is 33.52 kg/m. Last weight:  Wt Readings from Last 3 Encounters:  01/13/24 227 lb (103 kg)  12/22/23 226 lb (102.5 kg)  12/05/23 225 lb (102.1 kg)     Physical Exam Vitals reviewed.  Constitutional:      Appearance: Normal appearance. He is normal weight.  HENT:     Head: Normocephalic.     Nose: Nose normal.     Mouth/Throat:     Mouth: Mucous membranes are moist.  Eyes:     Pupils: Pupils are equal, round, and reactive to light.  Cardiovascular:     Rate and Rhythm: Normal rate and regular rhythm.     Pulses: Normal pulses.     Heart sounds: Normal heart sounds.  Pulmonary:     Effort: Pulmonary effort is normal.   Abdominal:     General: Abdomen is flat. Bowel sounds are normal.  Musculoskeletal:        General: Normal range of motion.     Cervical back: Normal range of motion.  Skin:    General: Skin is warm.  Neurological:     General: No focal deficit present.     Mental Status: He is alert.  Psychiatric:        Mood and Affect: Mood normal.       EKG:   Recent Labs: 05/01/2023: TSH 1.560 12/24/2023: ALT 22; BUN 18; Creatinine, Ser 1.36; Hemoglobin 15.5; Magnesium 2.3; Platelets 250; Potassium 4.5; Sodium 134    Lipid Panel    Component Value Date/Time   CHOL 327 (H) 12/24/2023 0812   TRIG 251 (H) 12/24/2023 0812   HDL 35 (L) 12/24/2023 0812   CHOLHDL 9.3 (H) 12/24/2023 0812   CHOLHDL 8.0 05/14/2020 1034   VLDL 37 05/14/2020 1034   LDLCALC 240 (H) 12/24/2023 8657      Other studies Reviewed: Additional studies/ records that were reviewed today include:  Review of the above records demonstrates:       No data to display            ASSESSMENT AND PLAN:    ICD-10-CM   1. Severe aortic stenosis  I35.0 amLODipine  (NORVASC ) 5 MG tablet    PCV ECHOCARDIOGRAM COMPLETE   Had mild to moderate AR 8/24, advise adding amlodapine 5 as BP elevated, and recheck echo.  Reassured the patient that controlling blood pressure needed.    2. Primary hypertension  I10 amLODipine  (NORVASC ) 5 MG tablet    PCV ECHOCARDIOGRAM COMPLETE   Explained to the patient that controlling blood pressure is the most important thing with prior history of TAVR and also prevention of endocarditis.    3. Mixed hyperlipidemia  E78.2 amLODipine  (NORVASC ) 5 MG tablet    PCV ECHOCARDIOGRAM COMPLETE    4. History of aortic valve replacement  Z95.2 amLODipine  (NORVASC ) 5 MG tablet    PCV ECHOCARDIOGRAM COMPLETE       Problem List Items Addressed This Visit       Cardiovascular and Mediastinum   Severe aortic stenosis - Primary   Relevant Medications   amLODipine  (NORVASC ) 5 MG tablet   Other  Relevant Orders   PCV ECHOCARDIOGRAM COMPLETE   Primary hypertension   Relevant Medications   amLODipine  (NORVASC ) 5 MG tablet   Other Relevant Orders   PCV ECHOCARDIOGRAM COMPLETE     Other   History of aortic valve replacement  Relevant Medications   amLODipine  (NORVASC ) 5 MG tablet   Other Relevant Orders   PCV ECHOCARDIOGRAM COMPLETE   Mixed hyperlipidemia   Relevant Medications   amLODipine  (NORVASC ) 5 MG tablet   Other Relevant Orders   PCV ECHOCARDIOGRAM COMPLETE       Disposition:   Return in about 4 weeks (around 02/10/2024) for echo and f/u.    Total time spent: 30 minutes  Signed,  Debborah Fairly, MD  01/13/2024 10:28 AM    [facility]

## 2024-01-21 ENCOUNTER — Other Ambulatory Visit: Payer: Self-pay | Admitting: Physician Assistant

## 2024-01-22 ENCOUNTER — Other Ambulatory Visit: Payer: Self-pay | Admitting: Cardiovascular Disease

## 2024-01-22 DIAGNOSIS — I10 Essential (primary) hypertension: Secondary | ICD-10-CM

## 2024-01-22 DIAGNOSIS — Z952 Presence of prosthetic heart valve: Secondary | ICD-10-CM

## 2024-01-22 DIAGNOSIS — I350 Nonrheumatic aortic (valve) stenosis: Secondary | ICD-10-CM

## 2024-01-22 DIAGNOSIS — E782 Mixed hyperlipidemia: Secondary | ICD-10-CM

## 2024-01-23 NOTE — Telephone Encounter (Signed)
 Requested medications are due for refill today.  no  Requested medications are on the active medications list.  Yes  Last refill. 12/29/2023 #30 1 rf  Future visit scheduled.   yes  Notes to clinic.  Pt is requesting a 90 day supply. New medication to this pt.     Requested Prescriptions  Pending Prescriptions Disp Refills   rosuvastatin  (CRESTOR ) 5 MG tablet [Pharmacy Med Name: ROSUVASTATIN  CALCIUM  5 MG TAB] 90 tablet 1    Sig: Take 1 tablet (5 mg total) by mouth daily.     Cardiovascular:  Antilipid - Statins 2 Failed - 01/23/2024  2:08 PM      Failed - Cr in normal range and within 360 days    Creatinine, Ser  Date Value Ref Range Status  12/24/2023 1.36 (H) 0.76 - 1.27 mg/dL Final         Failed - Lipid Panel in normal range within the last 12 months    Cholesterol, Total  Date Value Ref Range Status  12/24/2023 327 (H) 100 - 199 mg/dL Final   LDL Chol Calc (NIH)  Date Value Ref Range Status  12/24/2023 240 (H) 0 - 99 mg/dL Final   HDL  Date Value Ref Range Status  12/24/2023 35 (L) >39 mg/dL Final   Triglycerides  Date Value Ref Range Status  12/24/2023 251 (H) 0 - 149 mg/dL Final         Passed - Patient is not pregnant      Passed - Valid encounter within last 12 months    Recent Outpatient Visits           1 month ago Abdominal pain, unspecified abdominal location   [facility] Primary Care & Sports Medicine at [facility], Arleen Lacer, PA       Future Appointments             In 2 weeks Cherrie Cornwall, MD [facility]   In 1 month Larkin Plumb, Arleen Lacer, [facility] Primary Care & Sports Medicine at [facility], [facility]

## 2024-02-06 ENCOUNTER — Ambulatory Visit (INDEPENDENT_AMBULATORY_CARE_PROVIDER_SITE_OTHER)

## 2024-02-06 DIAGNOSIS — I361 Nonrheumatic tricuspid (valve) insufficiency: Secondary | ICD-10-CM

## 2024-02-06 DIAGNOSIS — I351 Nonrheumatic aortic (valve) insufficiency: Secondary | ICD-10-CM | POA: Diagnosis not present

## 2024-02-06 DIAGNOSIS — E782 Mixed hyperlipidemia: Secondary | ICD-10-CM

## 2024-02-06 DIAGNOSIS — Z952 Presence of prosthetic heart valve: Secondary | ICD-10-CM

## 2024-02-06 DIAGNOSIS — I10 Essential (primary) hypertension: Secondary | ICD-10-CM

## 2024-02-06 DIAGNOSIS — I350 Nonrheumatic aortic (valve) stenosis: Secondary | ICD-10-CM

## 2024-02-06 DIAGNOSIS — I340 Nonrheumatic mitral (valve) insufficiency: Secondary | ICD-10-CM

## 2024-02-10 ENCOUNTER — Encounter: Payer: Self-pay | Admitting: Cardiovascular Disease

## 2024-02-10 ENCOUNTER — Ambulatory Visit: Admitting: Cardiovascular Disease

## 2024-02-10 VITALS — BP 135/78 | HR 76 | Ht 69.0 in | Wt 227.5 lb

## 2024-02-10 DIAGNOSIS — I351 Nonrheumatic aortic (valve) insufficiency: Secondary | ICD-10-CM

## 2024-02-10 DIAGNOSIS — Z952 Presence of prosthetic heart valve: Secondary | ICD-10-CM

## 2024-02-10 DIAGNOSIS — E782 Mixed hyperlipidemia: Secondary | ICD-10-CM

## 2024-02-10 DIAGNOSIS — I10 Essential (primary) hypertension: Secondary | ICD-10-CM | POA: Diagnosis not present

## 2024-02-10 DIAGNOSIS — R109 Unspecified abdominal pain: Secondary | ICD-10-CM

## 2024-02-10 DIAGNOSIS — I350 Nonrheumatic aortic (valve) stenosis: Secondary | ICD-10-CM | POA: Diagnosis not present

## 2024-02-10 DIAGNOSIS — R002 Palpitations: Secondary | ICD-10-CM

## 2024-02-10 NOTE — Progress Notes (Signed)
 Cardiology Office Note   Date:  02/10/2024   ID:  Richard [name], DOB [birth year], MRN [id number]  PCP:  Leopoldo Rancher, PA  Cardiologist:  Debborah Fairly, MD      History of Present Illness: Richard [name] is a 73 y.o. male who presents for  Chief Complaint  Patient presents with   Follow-up    4 week follow up and echo results    Doing well, no chest pain or SOB.      Past Medical History:  Diagnosis Date   Allergy     Aortic stenosis    GERD (gastroesophageal reflux disease) 07/27/2023   Heart murmur 04/26/2020   Hyperlipidemia    Hypertension    Mitral valve regurgitation      Past Surgical History:  Procedure Laterality Date   CARDIAC VALVE REPLACEMENT  08/01/2020   TAVR   NO PAST SURGERIES     RIGHT/LEFT HEART CATH AND CORONARY ANGIOGRAPHY Bilateral 06/20/2020   Procedure: RIGHT/LEFT HEART CATH AND CORONARY ANGIOGRAPHY;  Surgeon: Cherrie Cornwall, MD;  Location: [facility] INVASIVE CV LAB;  Service: Cardiovascular;  Laterality: Bilateral;     Current Outpatient Medications  Medication Sig Dispense Refill   amLODipine  (NORVASC ) 5 MG tablet Take 1 tablet (5 mg total) by mouth daily. 30 tablet 11   amoxicillin  (AMOXIL ) 500 MG capsule Take 4 capsules (2,000 mg total) by mouth as directed. Take 4 tab 1 hour prior to dental work 4 capsule 4   Cholecalciferol (VITAMIN D3) 50 MCG (2000 UT) TABS Take 2,000 Units by mouth 3 (three) times a week.     Coenzyme Q10 (CO Q 10) 100 MG CAPS Take 100 mg by mouth daily.     losartan -hydrochlorothiazide (HYZAAR) 100-12.5 MG tablet TAKE 1 TABLET BY MOUTH EVERY DAY 90 tablet 2   metoprolol  succinate (TOPROL -XL) 50 MG 24 hr tablet Take 1 tablet (50 mg total) by mouth daily. Take with or immediately following a meal. 30 tablet 2   rosuvastatin  (CRESTOR ) 5 MG tablet Take 1 tablet (5 mg total) by mouth daily. 30 tablet 1   spironolactone  (ALDACTONE ) 25 MG tablet Take 1 tablet (25 mg total) by mouth daily. 30 tablet 11   aspirin   81 MG chewable tablet Chew by mouth. (Patient not taking: Reported on 02/10/2024)     No current facility-administered medications for this visit.    Allergies:   Alpha-gal, Glycerin, Heparin  (bovine), Crestor  [rosuvastatin ], and Zetia [ezetimibe]    Social History:   reports that he has never smoked. He quit smokeless tobacco use about 4 years ago.  His smokeless tobacco use included chew and snuff. He reports current alcohol use of about 1.0 standard drink of alcohol per week. He reports that he does not use drugs.   Family History:  family history includes Colon cancer in his mother; Hyperlipidemia in his mother; Other in his father; Rectal cancer in his mother.    ROS:     Review of Systems  Constitutional: Negative.   HENT: Negative.    Eyes: Negative.   Respiratory: Negative.    Gastrointestinal: Negative.   Genitourinary: Negative.   Musculoskeletal: Negative.   Skin: Negative.   Neurological: Negative.   Endo/Heme/Allergies: Negative.   Psychiatric/Behavioral: Negative.    All other systems reviewed and are negative.     All other systems are reviewed and negative.    PHYSICAL EXAM: VS:  BP 135/78   Pulse 76   Ht 5' 9 (1.753 m)  Wt 227 lb 8 oz (103.2 kg)   SpO2 98%   BMI 33.60 kg/m  , BMI Body mass index is 33.6 kg/m. Last weight:  Wt Readings from Last 3 Encounters:  02/10/24 227 lb 8 oz (103.2 kg)  01/13/24 227 lb (103 kg)  12/22/23 226 lb (102.5 kg)     Physical Exam    EKG:   Recent Labs: 05/01/2023: TSH 1.560 12/24/2023: ALT 22; BUN 18; Creatinine, Ser 1.36; Hemoglobin 15.5; Magnesium 2.3; Platelets 250; Potassium 4.5; Sodium 134    Lipid Panel    Component Value Date/Time   CHOL 327 (H) 12/24/2023 0812   TRIG 251 (H) 12/24/2023 0812   HDL 35 (L) 12/24/2023 0812   CHOLHDL 9.3 (H) 12/24/2023 0812   CHOLHDL 8.0 05/14/2020 1034   VLDL 37 05/14/2020 1034   LDLCALC 240 (H) 12/24/2023 1610      Other studies Reviewed: Additional studies/  records that were reviewed today include:  Review of the above records demonstrates:       No data to display            ASSESSMENT AND PLAN:    ICD-10-CM   1. Primary hypertension  I10     2. Mixed hyperlipidemia  E78.2     3. History of aortic valve replacement  Z95.2     4. Severe aortic stenosis  I35.0     5. Abdominal pain, unspecified abdominal location  R10.9     6. Palpitation  R00.2     7. Aortic valve insufficiency, etiology of cardiac valve disease unspecified  I35.1    Added amlodapine 5 but non compliantand advissed to take it as has moderate AR now.       Problem List Items Addressed This Visit       Cardiovascular and Mediastinum   Severe aortic stenosis   Primary hypertension - Primary     Other   History of aortic valve replacement   Mixed hyperlipidemia   Abdominal pain   Other Visit Diagnoses       Palpitation         Aortic valve insufficiency, etiology of cardiac valve disease unspecified       Added amlodapine 5 but non compliantand advissed to take it as has moderate AR now.          Disposition:   Return in about 3 months (around 05/12/2024).    Total time spent: 30 minutes  Signed,  Debborah Fairly, MD  02/10/2024 9:21 AM    [facility]

## 2024-02-23 ENCOUNTER — Other Ambulatory Visit: Payer: Self-pay | Admitting: Physician Assistant

## 2024-02-24 NOTE — Telephone Encounter (Signed)
 Requested Prescriptions  Pending Prescriptions Disp Refills   rosuvastatin  (CRESTOR ) 5 MG tablet [Pharmacy Med Name: ROSUVASTATIN  CALCIUM  5 MG TAB] 90 tablet 0    Sig: TAKE 1 TABLET (5 MG TOTAL) BY MOUTH DAILY.     Cardiovascular:  Antilipid - Statins 2 Failed - 02/24/2024  3:38 PM      Failed - Cr in normal range and within 360 days    Creatinine, Ser  Date Value Ref Range Status  12/24/2023 1.36 (H) 0.76 - 1.27 mg/dL Final         Failed - Lipid Panel in normal range within the last 12 months    Cholesterol, Total  Date Value Ref Range Status  12/24/2023 327 (H) 100 - 199 mg/dL Final   LDL Chol Calc (NIH)  Date Value Ref Range Status  12/24/2023 240 (H) 0 - 99 mg/dL Final   HDL  Date Value Ref Range Status  12/24/2023 35 (L) >39 mg/dL Final   Triglycerides  Date Value Ref Range Status  12/24/2023 251 (H) 0 - 149 mg/dL Final         Passed - Patient is not pregnant      Passed - Valid encounter within last 12 months    Recent Outpatient Visits           2 months ago Abdominal pain, unspecified abdominal location   [facility] Primary Care & Sports Medicine at [facility], Toribio SQUIBB, PA       Future Appointments             In 3 weeks Manya, Toribio SQUIBB, PA [facility] Primary Care & Sports Medicine at [facility], [facility]   In 2 months Fernand Denyse LABOR, MD [facility]

## 2024-03-01 ENCOUNTER — Ambulatory Visit: Admitting: Cardiovascular Disease

## 2024-03-05 ENCOUNTER — Ambulatory Visit: Admitting: Cardiovascular Disease

## 2024-03-22 ENCOUNTER — Encounter: Payer: Self-pay | Admitting: Physician Assistant

## 2024-03-22 ENCOUNTER — Ambulatory Visit: Admitting: Physician Assistant

## 2024-03-22 VITALS — BP 112/60 | HR 69 | Temp 98.0°F | Ht 69.0 in | Wt 228.0 lb

## 2024-03-22 DIAGNOSIS — E782 Mixed hyperlipidemia: Secondary | ICD-10-CM | POA: Diagnosis not present

## 2024-03-22 DIAGNOSIS — Z91018 Allergy to other foods: Secondary | ICD-10-CM

## 2024-03-22 MED ORDER — EPINEPHRINE 0.3 MG/0.3ML IJ SOAJ
0.3000 mg | INTRAMUSCULAR | 1 refills | Status: AC | PRN
Start: 2024-03-22 — End: ?

## 2024-03-22 MED ORDER — ROSUVASTATIN CALCIUM 10 MG PO TABS: 10.0000 mg | ORAL_TABLET | Freq: Every day | ORAL | 1 refills | Status: DC

## 2024-03-22 NOTE — Assessment & Plan Note (Signed)
>>  ASSESSMENT AND PLAN FOR ALLERGY  TO ALPHA-GAL WRITTEN ON 03/22/2024 12:50 PM BY Zacharia Sowles P, PA  Sending to [facility] for consult as requested

## 2024-03-22 NOTE — Progress Notes (Signed)
 Date:  03/22/2024   Name:  Richard Richard [name]   DOB:  Sep 25, 1950   MRN:  [id number]   Chief Complaint: Hypertension and Hyperlipidemia  Hypertension  Hyperlipidemia   Richard Richard [name] presents for 29-month follow-up on chronic conditions including  HTN, HLD, statin intolerance, severe aortic stenosis s/p TAVR.   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  He has been diligent about researching this condition and making dietary modifications to minimize his symptoms, and has done so with good effect.  He did raise some concerns for premature heart valve failure due to the alpha gal, and brought this up to his cardiologist who did not seem particularly concerned per patient.  He would like to be referred to [facility] allergy  and immunology for consult.  He believes he misattributed symptoms of alpha gal to statin intolerance, so shortly following our last visit we restarted rosuvastatin  5 mg which she has taken with good compliance and tolerance, and he is asking to increase to 10 mg today.  His last LDL was 240, but he is not fasting today for repeat.  He also reports that since making modifications to his diet for alpha-gal, his blood pressure has significantly improved.  This is true on home readings and clinic readings.  Medication list has been reviewed and updated.  Current Meds  Medication Sig   amLODipine  (NORVASC ) 5 MG tablet Take 1 tablet (5 mg total) by mouth daily.   amoxicillin  (AMOXIL ) 500 MG capsule Take 4 capsules (2,000 mg total) by mouth as directed. Take 4 tab 1 hour prior to dental work   aspirin  81 MG chewable tablet Chew by mouth.   Cholecalciferol (VITAMIN D3) 50 MCG (2000 UT) TABS Take 2,000 Units by mouth 3 (three) times a week.   Coenzyme Q10 (CO Q 10) 100 MG CAPS Take 100 mg by mouth daily.   EPINEPHrine  0.3 mg/0.3 mL IJ SOAJ injection Inject 0.3 mg into the muscle as needed for anaphylaxis.    losartan -hydrochlorothiazide (HYZAAR) 100-12.5 MG tablet TAKE 1 TABLET BY MOUTH EVERY DAY   metoprolol  succinate (TOPROL -XL) 50 MG 24 hr tablet Take 1 tablet (50 mg total) by mouth daily. Take with or immediately following a meal.   spironolactone  (ALDACTONE ) 25 MG tablet Take 1 tablet (25 mg total) by mouth daily.   [DISCONTINUED] rosuvastatin  (CRESTOR ) 5 MG tablet TAKE 1 TABLET (5 MG TOTAL) BY MOUTH DAILY.     Review of Systems  Patient Active Problem List   Diagnosis Date Noted   Allergy  to alpha-gal 12/29/2023   PVC's (premature ventricular contractions) 12/22/2023   Diverticulosis of colon 12/22/2023   Inguinal hernia, left 12/22/2023   Abdominal pain 12/22/2023   Bladder diverticulum 12/22/2023   Primary hypertension 12/02/2022   Mixed hyperlipidemia 12/02/2022   History of aortic valve replacement 04/22/2022   Severe aortic stenosis 06/15/2020    Allergies  Allergen Reactions   Alpha-Gal     All Mammal Meat,    Glycerin     * mammal glycerin   Heparin  (Bovine)    Crestor  [Rosuvastatin ] Diarrhea   Zetia [Ezetimibe] Photosensitivity and Rash    Immunization History  Administered Date(s) Administered   Tdap 02/22/2015    Past Surgical History:  Procedure Laterality Date   CARDIAC VALVE REPLACEMENT  08/01/2020   TAVR   NO PAST SURGERIES     RIGHT/LEFT HEART CATH AND CORONARY ANGIOGRAPHY Bilateral 06/20/2020   Procedure: RIGHT/LEFT HEART CATH AND  CORONARY ANGIOGRAPHY;  Surgeon: Fernand Denyse LABOR, MD;  Location: [facility] INVASIVE CV LAB;  Service: Cardiovascular;  Laterality: Bilateral;    Social History   Tobacco Use   Smoking status: Never   Smokeless tobacco: Former    Types: Chew, Snuff    Quit date: 2021  Vaping Use   Vaping status: Never Used  Substance Use Topics   Alcohol use: Yes    Alcohol/week: 1.0 standard drink of alcohol    Types: 1 Cans of beer per week    Comment: occassional   Drug use: Never    Family History  Problem Relation Age of  Onset   Colon cancer Mother    Hyperlipidemia Mother    Rectal cancer Mother    Other Father        unknown medical istory        03/22/2024   10:13 AM 12/22/2023    1:24 PM  GAD 7 : Generalized Anxiety Score  Nervous, Anxious, on Edge 0 0  Control/stop worrying 0 0  Worry too much - different things 0 0  Trouble relaxing 0 0  Restless 0 0  Easily annoyed or irritable 0 0  Afraid - awful might happen 0 0  Total GAD 7 Score 0 0  Anxiety Difficulty Not difficult at all Not difficult at all       03/22/2024   10:13 AM 12/22/2023    1:24 PM 02/13/2021    7:57 AM  Depression screen PHQ 2/9  Decreased Interest 0 0 0  Down, Depressed, Hopeless 0 0 0  PHQ - 2 Score 0 0 0  Altered sleeping 0  0  Tired, decreased energy 0  0  Change in appetite 0  0  Feeling bad or failure about yourself  0  0  Trouble concentrating 0  0  Moving slowly or fidgety/restless 0  0  Suicidal thoughts 0  0  PHQ-9 Score 0  0  Difficult doing work/chores Not difficult at all  Not difficult at all    BP Readings from Last 3 Encounters:  03/22/24 112/60  02/10/24 135/78  01/13/24 (!) 148/82    Wt Readings from Last 3 Encounters:  03/22/24 228 lb (103.4 kg)  02/10/24 227 lb 8 oz (103.2 kg)  01/13/24 227 lb (103 kg)    BP 112/60   Pulse 69   Temp 98 F (36.7 C) (Oral)   Ht 5' 9 (1.753 m)   Wt 228 lb (103.4 kg)   SpO2 100%   BMI 33.67 kg/m   Physical Exam Vitals and nursing note reviewed.  Constitutional:      Appearance: Normal appearance.  Cardiovascular:     Rate and Rhythm: Normal rate.  Pulmonary:     Effort: Pulmonary effort is normal.  Abdominal:     General: There is no distension.  Musculoskeletal:        General: Normal range of motion.  Skin:    General: Skin is warm and dry.  Neurological:     Mental Status: He is alert and oriented to person, place, and time.     Gait: Gait is intact.  Psychiatric:        Mood and Affect: Mood and affect normal.     Recent  Labs     Component Value Date/Time   NA 134 12/24/2023 0812   K 4.5 12/24/2023 0812   CL 98 12/24/2023 0812   CO2 21 12/24/2023 0812   GLUCOSE 104 (H) 12/24/2023 9187  GLUCOSE 80 05/13/2023 1627   Richard [name] 18 12/24/2023 0812   CREATININE 1.36 (H) 12/24/2023 0812   CALCIUM  9.5 12/24/2023 0812   PROT 7.0 12/24/2023 0812   ALBUMIN 4.2 12/24/2023 0812   AST 23 12/24/2023 0812   ALT 22 12/24/2023 0812   ALKPHOS 56 12/24/2023 0812   BILITOT 0.4 12/24/2023 0812   GFRNONAA >60 05/13/2023 1627   GFRAA >60 05/14/2020 1034    Lab Results  Component Value Date   WBC 6.0 12/24/2023   HGB 15.5 12/24/2023   HCT 46.1 12/24/2023   MCV 93 12/24/2023   PLT 250 12/24/2023   Lab Results  Component Value Date   HGBA1C 6.0 (H) 05/01/2023   Lab Results  Component Value Date   CHOL 327 (H) 12/24/2023   HDL 35 (L) 12/24/2023   LDLCALC 240 (H) 12/24/2023   TRIG 251 (H) 12/24/2023   CHOLHDL 9.3 (H) 12/24/2023   Lab Results  Component Value Date   TSH 1.560 05/01/2023     Assessment and Plan:  Allergy  to alpha-gal Assessment & Plan: Sending to [facility] for consult as requested  Orders: -     Ambulatory referral to Allergy  -     EPINEPHrine ; Inject 0.3 mg into the muscle as needed for anaphylaxis.  Dispense: 1 each; Refill: 1  Mixed hyperlipidemia Assessment & Plan: Will increase rosuvastatin  to 10 mg daily and repeat fasting lipid panel at his convenience  Orders: -     Lipid panel -     Rosuvastatin  Calcium ; Take 1 tablet (10 mg total) by mouth daily.  Dispense: 90 tablet; Refill: 1     Return in about 4 months (around 07/23/2024) for OV f/u chronic conditions.    Rolan Hoyle, PA-C, DMSc, Nutritionist [facility] Primary Care and Sports Medicine [facility] [phone]

## 2024-03-22 NOTE — Assessment & Plan Note (Signed)
 Sending to [facility] for consult as requested

## 2024-03-22 NOTE — Assessment & Plan Note (Signed)
 Will increase rosuvastatin  to 10 mg daily and repeat fasting lipid panel at his convenience

## 2024-03-25 ENCOUNTER — Other Ambulatory Visit: Payer: Self-pay | Admitting: Cardiovascular Disease

## 2024-03-25 DIAGNOSIS — I350 Nonrheumatic aortic (valve) stenosis: Secondary | ICD-10-CM

## 2024-03-25 DIAGNOSIS — E782 Mixed hyperlipidemia: Secondary | ICD-10-CM

## 2024-03-25 DIAGNOSIS — Z952 Presence of prosthetic heart valve: Secondary | ICD-10-CM

## 2024-03-25 DIAGNOSIS — I10 Essential (primary) hypertension: Secondary | ICD-10-CM

## 2024-04-18 ENCOUNTER — Other Ambulatory Visit: Payer: Self-pay | Admitting: Cardiovascular Disease

## 2024-04-18 DIAGNOSIS — E782 Mixed hyperlipidemia: Secondary | ICD-10-CM

## 2024-04-18 DIAGNOSIS — I10 Essential (primary) hypertension: Secondary | ICD-10-CM

## 2024-04-18 DIAGNOSIS — Z952 Presence of prosthetic heart valve: Secondary | ICD-10-CM

## 2024-04-18 DIAGNOSIS — I350 Nonrheumatic aortic (valve) stenosis: Secondary | ICD-10-CM

## 2024-05-11 ENCOUNTER — Encounter: Payer: Self-pay | Admitting: Cardiovascular Disease

## 2024-05-11 ENCOUNTER — Ambulatory Visit (INDEPENDENT_AMBULATORY_CARE_PROVIDER_SITE_OTHER): Admitting: Cardiovascular Disease

## 2024-05-11 VITALS — BP 118/70 | HR 68 | Ht 69.0 in | Wt 228.6 lb

## 2024-05-11 DIAGNOSIS — E782 Mixed hyperlipidemia: Secondary | ICD-10-CM

## 2024-05-11 DIAGNOSIS — R002 Palpitations: Secondary | ICD-10-CM

## 2024-05-11 DIAGNOSIS — I350 Nonrheumatic aortic (valve) stenosis: Secondary | ICD-10-CM | POA: Diagnosis not present

## 2024-05-11 DIAGNOSIS — I10 Essential (primary) hypertension: Secondary | ICD-10-CM

## 2024-05-11 DIAGNOSIS — I351 Nonrheumatic aortic (valve) insufficiency: Secondary | ICD-10-CM

## 2024-05-11 NOTE — Progress Notes (Signed)
 Cardiology Office Note   Date:  05/11/2024   ID:  Richard [name], DOB 1951/07/11, MRN [id number]  PCP:  Manya Toribio SQUIBB, PA  Cardiologist:  Denyse Bathe, MD      History of Present Illness: Richard [name] is a 73 y.o. male who presents for  Chief Complaint  Patient presents with   Follow-up    3 month follow up    Has dizziness occasionally every other day.      Past Medical History:  Diagnosis Date   Allergy     Aortic stenosis    GERD (gastroesophageal reflux disease) 07/27/2023   Heart murmur 04/26/2020   Hyperlipidemia    Hypertension    Mitral valve regurgitation      Past Surgical History:  Procedure Laterality Date   CARDIAC VALVE REPLACEMENT  08/01/2020   TAVR   NO PAST SURGERIES     RIGHT/LEFT HEART CATH AND CORONARY ANGIOGRAPHY Bilateral 06/20/2020   Procedure: RIGHT/LEFT HEART CATH AND CORONARY ANGIOGRAPHY;  Surgeon: Bathe Denyse LABOR, MD;  Location: [facility] INVASIVE CV LAB;  Service: Cardiovascular;  Laterality: Bilateral;     Current Outpatient Medications  Medication Sig Dispense Refill   amLODipine  (NORVASC ) 5 MG tablet Take 1 tablet (5 mg total) by mouth daily. 30 tablet 11   aspirin  81 MG chewable tablet Chew by mouth.     cetirizine (ZYRTEC) 10 MG chewable tablet Chew 10 mg by mouth in the morning and at bedtime.     Cholecalciferol (VITAMIN D3) 50 MCG (2000 UT) TABS Take 2,000 Units by mouth 3 (three) times a week.     EPINEPHrine  0.3 mg/0.3 mL IJ SOAJ injection Inject 0.3 mg into the muscle as needed for anaphylaxis. 1 each 1   famotidine (PEPCID) 10 MG tablet Take 10 mg by mouth 2 (two) times daily.     losartan -hydrochlorothiazide (HYZAAR) 100-12.5 MG tablet TAKE 1 TABLET BY MOUTH EVERY DAY 90 tablet 2   metoprolol  succinate (TOPROL -XL) 50 MG 24 hr tablet TAKE 1 TABLET BY MOUTH DAILY. TAKE WITH OR IMMEDIATELY FOLLOWING A MEAL. 90 tablet 0   rosuvastatin  (CRESTOR ) 10 MG tablet Take 1 tablet (10 mg total) by mouth daily. (Patient  taking differently: Take 5 mg by mouth daily.) 90 tablet 1   spironolactone  (ALDACTONE ) 25 MG tablet TAKE 1 TABLET (25 MG TOTAL) BY MOUTH DAILY. 90 tablet 3   amoxicillin  (AMOXIL ) 500 MG capsule Take 4 capsules (2,000 mg total) by mouth as directed. Take 4 tab 1 hour prior to dental work (Patient not taking: Reported on 05/11/2024) 4 capsule 4   Coenzyme Q10 (CO Q 10) 100 MG CAPS Take 100 mg by mouth daily. (Patient not taking: Reported on 05/11/2024)     No current facility-administered medications for this visit.    Allergies:   Alpha-gal, Glycerin, Heparin  (bovine), Crestor  [rosuvastatin ], and Zetia [ezetimibe]    Social History:   reports that he has never smoked. He quit smokeless tobacco use about 4 years ago.  His smokeless tobacco use included chew and snuff. He reports current alcohol use of about 1.0 standard drink of alcohol per week. He reports that he does not use drugs.   Family History:  family history includes Colon cancer in his mother; Hyperlipidemia in his mother; Other in his father; Rectal cancer in his mother.    ROS:     Review of Systems  Constitutional: Negative.   HENT: Negative.    Eyes: Negative.   Respiratory: Negative.  Gastrointestinal: Negative.   Genitourinary: Negative.   Musculoskeletal: Negative.   Skin: Negative.   Neurological: Negative.   Endo/Heme/Allergies: Negative.   Psychiatric/Behavioral: Negative.    All other systems reviewed and are negative.     All other systems are reviewed and negative.    PHYSICAL EXAM: VS:  BP 118/70   Pulse 68   Ht 5' 9 (1.753 m)   Wt 228 lb 9.6 oz (103.7 kg)   SpO2 99%   BMI 33.76 kg/m  , BMI Body mass index is 33.76 kg/m. Last weight:  Wt Readings from Last 3 Encounters:  05/11/24 228 lb 9.6 oz (103.7 kg)  03/22/24 228 lb (103.4 kg)  02/10/24 227 lb 8 oz (103.2 kg)     Physical Exam Vitals reviewed.  Constitutional:      Appearance: Normal appearance. He is normal weight.  HENT:      Head: Normocephalic.     Nose: Nose normal.     Mouth/Throat:     Mouth: Mucous membranes are moist.  Eyes:     Pupils: Pupils are equal, round, and reactive to light.  Cardiovascular:     Rate and Rhythm: Normal rate and regular rhythm.     Pulses: Normal pulses.     Heart sounds: Normal heart sounds.  Pulmonary:     Effort: Pulmonary effort is normal.  Abdominal:     General: Abdomen is flat. Bowel sounds are normal.  Musculoskeletal:        General: Normal range of motion.     Cervical back: Normal range of motion.  Skin:    General: Skin is warm.  Neurological:     General: No focal deficit present.     Mental Status: He is alert.  Psychiatric:        Mood and Affect: Mood normal.       EKG:   Recent Labs: 12/24/2023: ALT 22; BUN 18; Creatinine, Ser 1.36; Hemoglobin 15.5; Magnesium 2.3; Platelets 250; Potassium 4.5; Sodium 134    Lipid Panel    Component Value Date/Time   CHOL 327 (H) 12/24/2023 0812   TRIG 251 (H) 12/24/2023 0812   HDL 35 (L) 12/24/2023 0812   CHOLHDL 9.3 (H) 12/24/2023 0812   CHOLHDL 8.0 05/14/2020 1034   VLDL 37 05/14/2020 1034   LDLCALC 240 (H) 12/24/2023 9187      Other studies Reviewed: Additional studies/ records that were reviewed today include:  Review of the above records demonstrates:       No data to display            ASSESSMENT AND PLAN:    ICD-10-CM   1. Mixed hyperlipidemia  E78.2     2. Primary hypertension  I10    has dizziness, and creat 1.36, may decrease hyzaar dose next time.    3. Severe aortic stenosis  I35.0     4. Palpitation  R00.2     5. Aortic valve insufficiency, etiology of cardiac valve disease unspecified  I35.1        Problem List Items Addressed This Visit       Cardiovascular and Mediastinum   Severe aortic stenosis   Primary hypertension     Other   Mixed hyperlipidemia - Primary   Other Visit Diagnoses       Palpitation         Aortic valve insufficiency, etiology of  cardiac valve disease unspecified  Disposition:   Return in about 3 months (around 08/10/2024).    Total time spent: 35 minutes  Signed,  Denyse Bathe, MD  05/11/2024 9:14 AM    [facility]

## 2024-06-20 ENCOUNTER — Other Ambulatory Visit: Payer: Self-pay | Admitting: Cardiology

## 2024-06-20 DIAGNOSIS — Z952 Presence of prosthetic heart valve: Secondary | ICD-10-CM

## 2024-06-20 DIAGNOSIS — E782 Mixed hyperlipidemia: Secondary | ICD-10-CM

## 2024-06-20 DIAGNOSIS — I10 Essential (primary) hypertension: Secondary | ICD-10-CM

## 2024-06-20 DIAGNOSIS — I350 Nonrheumatic aortic (valve) stenosis: Secondary | ICD-10-CM

## 2024-06-24 ENCOUNTER — Ambulatory Visit: Admitting: Anesthesiology

## 2024-06-24 ENCOUNTER — Encounter
Admission: RE | Disposition: A | Discharge status: Home / Self Care | Payer: Self-pay | Source: Home / Self Care | Attending: Gastroenterology

## 2024-06-24 ENCOUNTER — Encounter: Payer: Self-pay | Admitting: Gastroenterology

## 2024-06-24 ENCOUNTER — Ambulatory Visit
Admission: RE | Admit: 2024-06-24 | Discharge: 2024-06-24 | Disposition: A | Discharge status: Home / Self Care | Attending: Gastroenterology | Admitting: Gastroenterology

## 2024-06-24 DIAGNOSIS — K5730 Diverticulosis of large intestine without perforation or abscess without bleeding: Secondary | ICD-10-CM | POA: Insufficient documentation

## 2024-06-24 DIAGNOSIS — K2970 Gastritis, unspecified, without bleeding: Secondary | ICD-10-CM | POA: Insufficient documentation

## 2024-06-24 DIAGNOSIS — Z79899 Other long term (current) drug therapy: Secondary | ICD-10-CM | POA: Insufficient documentation

## 2024-06-24 DIAGNOSIS — K219 Gastro-esophageal reflux disease without esophagitis: Secondary | ICD-10-CM | POA: Insufficient documentation

## 2024-06-24 DIAGNOSIS — D123 Benign neoplasm of transverse colon: Secondary | ICD-10-CM | POA: Diagnosis not present

## 2024-06-24 DIAGNOSIS — Z952 Presence of prosthetic heart valve: Secondary | ICD-10-CM | POA: Insufficient documentation

## 2024-06-24 DIAGNOSIS — K635 Polyp of colon: Secondary | ICD-10-CM | POA: Insufficient documentation

## 2024-06-24 DIAGNOSIS — Z1211 Encounter for screening for malignant neoplasm of colon: Secondary | ICD-10-CM | POA: Diagnosis present

## 2024-06-24 DIAGNOSIS — D122 Benign neoplasm of ascending colon: Secondary | ICD-10-CM | POA: Insufficient documentation

## 2024-06-24 DIAGNOSIS — I10 Essential (primary) hypertension: Secondary | ICD-10-CM | POA: Insufficient documentation

## 2024-06-24 HISTORY — PX: COLONOSCOPY: SHX5424

## 2024-06-24 HISTORY — PX: ESOPHAGOGASTRODUODENOSCOPY: SHX5428

## 2024-06-24 HISTORY — PX: POLYPECTOMY: SHX149

## 2024-06-24 SURGERY — COLONOSCOPY
Anesthesia: General

## 2024-06-24 MED ORDER — GLYCOPYRROLATE 0.2 MG/ML IJ SOLN
INTRAMUSCULAR | Status: DC | PRN
  Administered 2024-06-24: .2 mg via INTRAVENOUS

## 2024-06-24 MED ORDER — EPHEDRINE SULFATE-NACL 50-0.9 MG/10ML-% IV SOSY
PREFILLED_SYRINGE | INTRAVENOUS | Status: DC | PRN
  Administered 2024-06-24: 10 mg via INTRAVENOUS
  Administered 2024-06-24: 5 mg via INTRAVENOUS

## 2024-06-24 MED ORDER — PROPOFOL 500 MG/50ML IV EMUL
INTRAVENOUS | Status: DC | PRN
  Administered 2024-06-24: 165 ug/kg/min via INTRAVENOUS

## 2024-06-24 MED ORDER — SODIUM CHLORIDE 0.9 % IV SOLN
INTRAVENOUS | Status: DC
  Administered 2024-06-24: 500 mL via INTRAVENOUS

## 2024-06-24 MED ORDER — PROPOFOL 10 MG/ML IV BOLUS
INTRAVENOUS | Status: DC | PRN
  Administered 2024-06-24: 20 mg via INTRAVENOUS
  Administered 2024-06-24: 60 mg via INTRAVENOUS
  Administered 2024-06-24: 20 mg via INTRAVENOUS

## 2024-06-24 MED ORDER — LIDOCAINE HCL (CARDIAC) PF 100 MG/5ML IV SOSY
PREFILLED_SYRINGE | INTRAVENOUS | Status: DC | PRN
Start: 2024-06-24 — End: 2024-06-24
  Administered 2024-06-24: 100 mg via INTRAVENOUS

## 2024-06-24 NOTE — H&P (Signed)
 Pre-Procedure H&P   Patient ID: Richard [name] is a 73 y.o. male.  Gastroenterology Provider: Elspeth Ozell Jungling, DO  Referring Provider: Toribio Hoyle, PA PCP: Hoyle Toribio SQUIBB, [place]  Date: 06/24/2024  HPI [name] is a 73 y.o. male who presents today for Esophagogastroduodenoscopy and Colonoscopy for [name], colorectal cancer screening .  Patient diagnosed with alpha gal allergy  that was causing abdominal pain and discomfort.  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  Have requested special staining for CD117 CD25 CD2 CD30 with comments and morphology.  History of aortic stenosis status post TAVR  No melena or hematochezia   Past Medical History:  Diagnosis Date   Allergy     Aortic stenosis    GERD (gastroesophageal reflux disease) 07/27/2023   Heart murmur 04/26/2020   Hyperlipidemia    Hypertension    Mitral valve regurgitation     Past Surgical History:  Procedure Laterality Date   CARDIAC CATHETERIZATION     CARDIAC VALVE REPLACEMENT  08/01/2020   TAVR   NO PAST SURGERIES     RIGHT/LEFT HEART CATH AND CORONARY ANGIOGRAPHY Bilateral 06/20/2020   Procedure: RIGHT/LEFT HEART CATH AND CORONARY ANGIOGRAPHY;  Surgeon: Fernand Denyse LABOR, MD;  Location: [facility] INVASIVE CV LAB;  Service: Cardiovascular;  Laterality: Bilateral;    Family History Rectal cancer- mother 32s No other h/o GI disease or malignancy  Review of Systems  Constitutional:  Negative for activity change, appetite change, chills, diaphoresis, fatigue, fever and unexpected weight change.  HENT:  Negative for trouble swallowing and voice change.   Respiratory:  Negative for shortness of breath and wheezing.   Cardiovascular:  Negative for chest pain, palpitations and leg swelling.  Gastrointestinal:  Negative for abdominal distention, abdominal pain, anal bleeding, blood in stool, constipation, diarrhea, nausea and vomiting.  Musculoskeletal:  Negative for arthralgias and  myalgias.  Skin:  Negative for color change and pallor.  Neurological:  Negative for dizziness, syncope and weakness.  Psychiatric/Behavioral:  Negative for confusion. The patient is not nervous/anxious.   All other systems reviewed and are negative.    Medications No current facility-administered medications on file prior to encounter.   Current Outpatient Medications on File Prior to Encounter  Medication Sig Dispense Refill   amLODipine  (NORVASC ) 5 MG tablet Take 1 tablet (5 mg total) by mouth daily. 30 tablet 11   cetirizine (ZYRTEC) 10 MG chewable tablet Chew 10 mg by mouth in the morning and at bedtime.     Cholecalciferol (VITAMIN D3) 50 MCG (2000 UT) TABS Take 2,000 Units by mouth 3 (three) times a week.     famotidine (PEPCID) 10 MG tablet Take 10 mg by mouth 2 (two) times daily.     losartan -hydrochlorothiazide (HYZAAR) 100-12.5 MG tablet TAKE 1 TABLET BY MOUTH EVERY DAY 90 tablet 2   spironolactone  (ALDACTONE ) 25 MG tablet TAKE 1 TABLET (25 MG TOTAL) BY MOUTH DAILY. 90 tablet 3   amoxicillin  (AMOXIL ) 500 MG capsule Take 4 capsules (2,000 mg total) by mouth as directed. Take 4 tab 1 hour prior to dental work (Patient not taking: Reported on 05/11/2024) 4 capsule 4   aspirin  81 MG chewable tablet Chew by mouth. (Patient not taking: Reported on 06/24/2024)     Coenzyme Q10 (CO Q 10) 100 MG CAPS Take 100 mg by mouth daily. (Patient not taking: Reported on 05/11/2024)     EPINEPHrine  0.3 mg/0.3 mL IJ SOAJ injection Inject 0.3 mg into the muscle as needed for anaphylaxis. 1 each 1  rosuvastatin  (CRESTOR ) 10 MG tablet Take 1 tablet (10 mg total) by mouth daily. (Patient taking differently: Take 5 mg by mouth daily.) 90 tablet 1    Pertinent medications related to GI and procedure were reviewed by me with the patient prior to the procedure   Current Facility-Administered Medications:    0.9 %  sodium chloride  infusion, , Intravenous, Continuous, Desiderio, Dolata, DO, Last Rate: 20  mL/hr at 06/24/24 1012, 500 mL at 06/24/24 1012  sodium chloride  500 mL (06/24/24 1012)       Allergies  Allergen Reactions   Alpha-Gal     All Mammal Meat,    Glycerin     * mammal glycerin   Heparin  (Bovine)    Crestor  [Rosuvastatin ] Diarrhea   Zetia [Ezetimibe] Photosensitivity and Rash   Allergies were reviewed by me prior to the procedure  Objective   Body mass index is 31.71 kg/m. Vitals:   06/24/24 0954 06/24/24 0957  BP:  136/70  Pulse: 67   Resp: 18   Temp: (!) 96.9 F (36.1 C)   TempSrc: Temporal   SpO2: 100%   Weight: 100.2 kg   Height: 5' 10 (1.778 m)      Physical Exam Vitals and nursing note reviewed.  Constitutional:      General: He is not in acute distress.    Appearance: Normal appearance. He is not ill-appearing, toxic-appearing or diaphoretic.  HENT:     Head: Normocephalic and atraumatic.     Nose: Nose normal.     Mouth/Throat:     Mouth: Mucous membranes are moist.     Pharynx: Oropharynx is clear.  Eyes:     General: No scleral icterus.    Extraocular Movements: Extraocular movements intact.  Cardiovascular:     Rate and Rhythm: Normal rate and regular rhythm.     Heart sounds: Murmur heard.     No friction rub. No gallop.  Pulmonary:     Effort: Pulmonary effort is normal. No respiratory distress.     Breath sounds: Normal breath sounds. No wheezing, rhonchi or rales.  Abdominal:     General: Abdomen is flat. Bowel sounds are normal. There is no distension.     Palpations: Abdomen is soft.     Tenderness: There is no abdominal tenderness. There is no guarding or rebound.  Musculoskeletal:     Cervical back: Neck supple.     Right lower leg: No edema.     Left lower leg: No edema.  Skin:    General: Skin is warm and dry.     Coloration: Skin is not jaundiced or pale.  Neurological:     General: No focal deficit present.     Mental Status: He is alert and oriented to person, place, and time. Mental status is at baseline.   Psychiatric:        Mood and Affect: Mood normal.        Behavior: Behavior normal.        Thought Content: Thought content normal.        Judgment: Judgment normal.      Assessment:  Richard [name] is a 73 y.o. male  who presents today for Esophagogastroduodenoscopy and Colonoscopy for [name], colorectal cancer screening .  Plan:  Esophagogastroduodenoscopy and Colonoscopy with possible intervention today  Esophagogastroduodenoscopy and Colonoscopy with possible biopsy, control of bleeding, polypectomy, and interventions as necessary has been discussed with the patient/patient representative. Informed consent was obtained from the patient/patient representative after  explaining the indication, nature, and risks of the procedure including but not limited to death, bleeding, perforation, missed neoplasm/lesions, cardiorespiratory compromise, and reaction to medications. Opportunity for questions was given and appropriate answers were provided. Patient/patient representative has verbalized understanding is amenable to undergoing the procedure.   Elspeth Ozell Jungling, DO  [facility] Gastroenter[facility]  Portions of the record may have been created with voice recognition software. Occasional wrong-word or 'sound-a-like' substitutions may have occurred due to the inherent limitations of voice recognition software.  Read the chart carefully and recognize, using context, where substitutions may have occurred.

## 2024-06-24 NOTE — Anesthesia Preprocedure Evaluation (Signed)
 Anesthesia Evaluation  Patient identified by MRN, date of birth, ID band Patient awake    Reviewed: Allergy  & Precautions, NPO status , Patient's Chart, lab work & pertinent test results  Airway Mallampati: II  TM Distance: >3 FB Neck ROM: Full    Dental  (+) Teeth Intact   Pulmonary neg pulmonary ROS   Pulmonary exam normal breath sounds clear to auscultation       Cardiovascular Exercise Tolerance: Good hypertension, Pt. on medications negative cardio ROS Normal cardiovascular exam+ Valvular Problems/Murmurs AS  Rhythm:Regular Rate:Normal  S/p Aortic Valve replacement   Neuro/Psych negative neurological ROS  negative psych ROS   GI/Hepatic negative GI ROS, Neg liver ROS,GERD  Medicated,,  Endo/Other  negative endocrine ROS    Renal/GU negative Renal ROS  negative genitourinary   Musculoskeletal   Abdominal Normal abdominal exam  (+)   Peds  Hematology negative hematology ROS (+)   Anesthesia Other Findings Past Medical History: No date: Allergy  No date: Aortic stenosis 07/27/2023: GERD (gastroesophageal reflux disease) 04/26/2020: Heart murmur No date: Hyperlipidemia No date: Hypertension No date: Mitral valve regurgitation  Past Surgical History: 08/01/2020: CARDIAC VALVE REPLACEMENT     Comment:  TAVR No date: NO PAST SURGERIES 06/20/2020: RIGHT/LEFT HEART CATH AND CORONARY ANGIOGRAPHY; Bilateral     Comment:  Procedure: RIGHT/LEFT HEART CATH AND CORONARY               ANGIOGRAPHY;  Surgeon: Fernand Denyse LABOR, MD;  Location:               [facility] INVASIVE CV LAB;  Service: Cardiovascular;                Laterality: Bilateral;  BMI    Body Mass Index: 31.71 kg/m      Reproductive/Obstetrics negative OB ROS                              Anesthesia Physical Anesthesia Plan  ASA: 2  Anesthesia Plan: General   Post-op Pain Management:    Induction: Intravenous  PONV  Risk Score and Plan: Propofol infusion and TIVA  Airway Management Planned: Natural Airway and Nasal Cannula  Additional Equipment:   Intra-op Plan:   Post-operative Plan:   Informed Consent: I have reviewed the patients History and Physical, chart, labs and discussed the procedure including the risks, benefits and alternatives for the proposed anesthesia with the patient or authorized representative who has indicated his/her understanding and acceptance.     Dental Advisory Given  Plan Discussed with: CRNA  Anesthesia Plan Comments:          Anesthesia Quick Evaluation

## 2024-06-24 NOTE — Op Note (Signed)
 [facility]astroenterology Patient Name: Richard [name] Procedure Date: 06/24/2024 10:46 AM MRN: [id number] Account #: [id number] Date of Birth: 05/07/1951 Admit Type: Outpatient Age: 73 Room: [facility] ENDO ROOM 1 Gender: Male Note Status: Finalized Instrument Name: Colon Scope [id number] Procedure:             Colonoscopy Indications:           Screening for colorectal malignant neoplasm Providers:             Elspeth Ozell Onita ROSALEA, DO Referring MD:          Toribio SQUIBB. [place] (Referring MD) Medicines:             Monitored Anesthesia Care Complications:         No immediate complications. Estimated blood loss:                         Minimal. Procedure:             Pre-Anesthesia Assessment:                        - Prior to the procedure, a History and Physical was                         performed, and patient medications and allergies were                         reviewed. The patient is competent. The risks and                         benefits of the procedure and the sedation options and                         risks were discussed with the patient. All questions                         were answered and informed consent was obtained.                         Patient identification and proposed procedure were                         verified by the physician, the nurse, the anesthetist                         and the technician in the endoscopy suite. Mental                         Status Examination: alert and oriented. Airway                         Examination: normal oropharyngeal airway and neck                         mobility. Respiratory Examination: clear to                         auscultation. CV Examination: systolic murmur.  Prophylactic Antibiotics: The patient does not require                         prophylactic antibiotics. Prior Anticoagulants: The                         patient has taken no anticoagulant or  antiplatelet                         agents. ASA Grade Assessment: III - A patient with                         severe systemic disease. After reviewing the risks and                         benefits, the patient was deemed in satisfactory                         condition to undergo the procedure. The anesthesia                         plan was to use monitored anesthesia care (MAC).                         Immediately prior to administration of medications,                         the patient was re-assessed for adequacy to receive                         sedatives. The heart rate, respiratory rate, oxygen                         saturations, blood pressure, adequacy of pulmonary                         ventilation, and response to care were monitored                         throughout the procedure. The physical status of the                         patient was re-assessed after the procedure.                        After obtaining informed consent, the colonoscope was                         passed under direct vision. Throughout the procedure,                         the patient's blood pressure, pulse, and oxygen                         saturations were monitored continuously. The                         Colonoscope was introduced through the anus and  advanced to the the terminal ileum, with                         identification of the appendiceal orifice and IC                         valve. The colonoscopy was performed without                         difficulty. The patient tolerated the procedure well.                         The quality of the bowel preparation was evaluated                         using the BBPS [facility] Bowel Preparation Scale) with                         scores of: Right Colon = 3, Transverse Colon = 3 and                         Left Colon = 3 (entire mucosa seen well with no                         residual staining, small fragments  of stool or opaque                         liquid). The total BBPS score equals 9. The terminal                         ileum, ileocecal valve, appendiceal orifice, and                         rectum were photographed. Findings:      The perianal and digital rectal examinations were normal. Pertinent       negatives include normal sphincter tone.      The terminal ileum appeared normal. Biopsies were taken with a cold       forceps for histology. Estimated blood loss was minimal.      Retroflexion in the right colon was performed.      Five sessile polyps were found in the recto-sigmoid colon (2),       transverse colon (1) and ascending colon (2). The polyps were 1 to 2 mm       in size. These polyps were removed with a jumbo cold forceps. Resection       and retrieval were complete. Estimated blood loss was minimal.      A 3 to 4 mm polyp was found in the transverse colon. The polyp was       sessile. The polyp was removed with a cold snare. Resection and       retrieval were complete. Estimated blood loss was minimal.      Multiple small-mouthed diverticula were found in the sigmoid colon.       Estimated blood loss: none.      Normal mucosa was found in the entire colon. Biopsies were taken with a       cold forceps for histology. staining for CD117 CD25 CD2 CD30  with       comments and morphology requested. Estimated blood loss was minimal.      The exam was otherwise without abnormality on direct and retroflexion       views. Impression:            - The examined portion of the ileum was normal.                         Biopsied.                        - Five 1 to 2 mm polyps at the recto-sigmoid colon, in                         the transverse colon and in the ascending colon,                         removed with a jumbo cold forceps. Resected and                         retrieved.                        - One 3 to 4 mm polyp in the transverse colon, removed                          with a cold snare. Resected and retrieved.                        - Diverticulosis in the sigmoid colon.                        - Normal mucosa in the entire examined colon. Biopsied.                        - The examination was otherwise normal on direct and                         retroflexion views. Recommendation:        - Patient has a contact number available for                         emergencies. The signs and symptoms of potential                         delayed complications were discussed with the patient.                         Return to normal activities tomorrow. Written                         discharge instructions were provided to the patient.                        - Discharge patient to home.                        - Resume previous diet.                        -  Continue present medications.                        - Await pathology results.                        - Repeat colonoscopy for surveillance based on                         pathology results.                        - Return to referring physician as previously                         scheduled.                        - The findings and recommendations were discussed with                         the patient.                        - The findings and recommendations were discussed with                         the patient's family. Procedure Code(s):     --- Professional ---                        [id number], Colonoscopy, flexible; with removal of                         tumor(s), polyp(s), or other lesion(s) by snare                         technique                        45380, 59, Colonoscopy, flexible; with biopsy, single                         or multiple Diagnosis Code(s):     --- Professional ---                        Z12.11, Encounter for screening for malignant neoplasm                         of colon                        D12.7, Benign neoplasm of rectosigmoid junction                         D12.3, Benign neoplasm of transverse colon (hepatic                         flexure or splenic flexure)                        D12.2, Benign neoplasm of ascending colon  K57.30, Diverticulosis of large intestine without                         perforation or abscess without bleeding CPT copyright 2022 American [employer]. All rights reserved. The codes documented in this report are preliminary and upon coder review may  be revised to meet current compliance requirements. Attending Participation:      I personally performed the entire procedure. Elspeth Jungling, DO Elspeth Ozell Jungling DO, DO 06/24/2024 11:32:52 AM This report has been signed electronically. Number of Addenda: 0 Note Initiated On: 06/24/2024 10:46 AM Scope Withdrawal Time: 0 hours 16 minutes 12 seconds  Total Procedure Duration: 0 hours 20 minutes 52 seconds  Estimated Blood Loss:  Estimated blood loss was minimal.      [facility]

## 2024-06-24 NOTE — Op Note (Signed)
 [facility] Gastroenterology Patient Name: Richard [name] Procedure Date: 06/24/2024 10:47 AM MRN: [id number] Account #: [id number] Date of Birth: 05-10-1951 Admit Type: Outpatient Age: 73 Room: [facility] ENDO ROOM 1 Gender: Male Note Status: Finalized Instrument Name: Upper GI Scope [id number] Procedure:             Upper GI endoscopy Indications:           GERD Providers:             Elspeth Ozell Onita ROSALEA, DO Referring MD:          Toribio SQUIBB. Manya (Referring MD) Medicines:             Monitored Anesthesia Care Complications:         No immediate complications. Estimated blood loss:                         Minimal. Procedure:             Pre-Anesthesia Assessment:                        - Prior to the procedure, a History and Physical was                         performed, and patient medications and allergies were                         reviewed. The patient is competent. The risks and                         benefits of the procedure and the sedation options and                         risks were discussed with the patient. All questions                         were answered and informed consent was obtained.                         Patient identification and proposed procedure were                         verified by the physician, the nurse, the anesthetist                         and the technician in the endoscopy suite. Mental                         Status Examination: alert and oriented. Airway                         Examination: normal oropharyngeal airway and neck                         mobility. Respiratory Examination: clear to                         auscultation. CV Examination: systolic murmur.  Prophylactic Antibiotics: The patient does not require                         prophylactic antibiotics. Prior Anticoagulants: The                         patient has taken no anticoagulant or antiplatelet                          agents. ASA Grade Assessment: II - A patient with mild                         systemic disease. After reviewing the risks and                         benefits, the patient was deemed in satisfactory                         condition to undergo the procedure. The anesthesia                         plan was to use monitored anesthesia care (MAC).                         Immediately prior to administration of medications,                         the patient was re-assessed for adequacy to receive                         sedatives. The heart rate, respiratory rate, oxygen                         saturations, blood pressure, adequacy of pulmonary                         ventilation, and response to care were monitored                         throughout the procedure. The physical status of the                         patient was re-assessed after the procedure.                        After obtaining informed consent, the endoscope was                         passed under direct vision. Throughout the procedure,                         the patient's blood pressure, pulse, and oxygen                         saturations were monitored continuously. The Endoscope                         was introduced through the mouth, and advanced to the  second part of duodenum. The upper GI endoscopy was                         accomplished without difficulty. The patient tolerated                         the procedure well. Findings:      The examined duodenum was normal. Biopsies were taken with a cold       forceps for histology. Estimated blood loss was minimal.      Localized mild inflammation characterized by erosions was found in the       gastric antrum. Biopsies were taken with a cold forceps for Helicobacter       pylori testing. Estimated blood loss was minimal.      Normal mucosa was found in the entire examined stomach. Biopsies were       taken with a cold forceps for  histology. Estimated blood loss was       minimal.      The exam of the stomach was otherwise normal.      The Z-line was regular.      Esophagogastric landmarks were identified: the gastroesophageal junction       was found at 40 cm from the incisors.      The examined esophagus was normal. Biopsies were taken with a cold       forceps for histology. Estimated blood loss was minimal.      staining for CD117 CD25 CD2 CD30 with comments and morphology requested Impression:            - Normal examined duodenum. Biopsied.                        - Gastritis. Biopsied.                        - Normal mucosa was found in the entire stomach.                         Biopsied.                        - Z-line regular.                        - Esophagogastric landmarks identified.                        - Normal esophagus. Biopsied. Recommendation:        - Patient has a contact number available for                         emergencies. The signs and symptoms of potential                         delayed complications were discussed with the patient.                         Return to normal activities tomorrow. Written                         discharge instructions were provided to the patient.                        -  Discharge patient to home.                        - Resume previous diet.                        - Continue present medications.                        - Await pathology results.                        - Repeat upper endoscopy for surveillance based on                         pathology results.                        - Return to referring physician as previously                         scheduled.                        - The findings and recommendations were discussed with                         the patient.                        - The findings and recommendations were discussed with                         the patient's family. Procedure Code(s):     --- Professional ---                         [phone], Esophagogastroduodenoscopy, flexible,                         transoral; with biopsy, single or multiple Diagnosis Code(s):     --- Professional ---                        K29.70, Gastritis, unspecified, without bleeding CPT copyright 2022 American Medical Association. All rights reserved. The codes documented in this report are preliminary and upon coder review may  be revised to meet current compliance requirements. Attending Participation:      I personally performed the entire procedure. Elspeth Jungling, DO Elspeth Ozell Jungling DO, DO 06/24/2024 11:28:17 AM This report has been signed electronically. Number of Addenda: 0 Note Initiated On: 06/24/2024 10:47 AM Estimated Blood Loss:  Estimated blood loss was minimal.      [facility]

## 2024-06-24 NOTE — Anesthesia Postprocedure Evaluation (Signed)
 Anesthesia Post Note  Patient: Richard [name]  Procedure(s) Performed: COLONOSCOPY EGD (ESOPHAGOGASTRODUODENOSCOPY)  Patient location during evaluation: PACU Anesthesia Type: General Level of consciousness: awake Vital Signs Assessment: post-procedure vital signs reviewed and stable Respiratory status: nonlabored ventilation Cardiovascular status: blood pressure returned to baseline Anesthetic complications: no   No notable events documented.   Last Vitals:  Vitals:   06/24/24 1130 06/24/24 1134  BP: (!) 89/57   Pulse: 85 86  Resp: 12 18  Temp: (!) 35.8 C   SpO2: 99% 97%    Last Pain:  Vitals:   06/24/24 1130  TempSrc: Temporal  PainSc: Asleep                 VAN STAVEREN,Khalil Szczepanik

## 2024-06-24 NOTE — Interval H&P Note (Signed)
 History and Physical Interval Note: Preprocedure H&P from 06/24/24  was reviewed and there was no interval change after seeing and examining the patient.  Written consent was obtained from the patient after discussion of risks, benefits, and alternatives. Patient has consented to proceed with Esophagogastroduodenoscopy and Colonoscopy with possible intervention   06/24/2024 10:46 AM  Richard [name]  has presented today for surgery, with the diagnosis of Colon cancer screening (Z12.11) Gastroesophageal reflux disease, unspecified whether esophagitis present (K21.9).  The various methods of treatment have been discussed with the patient and family. After consideration of risks, benefits and other options for treatment, the patient has consented to  Procedure(s): COLONOSCOPY (N/A) EGD (ESOPHAGOGASTRODUODENOSCOPY) (N/A) as a surgical intervention.  The patient's history has been reviewed, patient examined, no change in status, stable for surgery.  I have reviewed the patient's chart and labs.  Questions were answered to the patient's satisfaction.     Richard [name]

## 2024-06-24 NOTE — Transfer of Care (Signed)
 Immediate Anesthesia Transfer of Care Note  Patient: Richard [name]  Procedure(s) Performed: COLONOSCOPY EGD (ESOPHAGOGASTRODUODENOSCOPY)  Patient Location: Endoscopy Unit  Anesthesia Type:General  Level of Consciousness: drowsy and patient cooperative  Airway & Oxygen Therapy: Patient Spontanous Breathing and Patient connected to face mask oxygen  Post-op Assessment: Report given to RN and Post -op Vital signs reviewed and stable  Post vital signs: Reviewed and stable  Last Vitals:  Vitals Value Taken Time  BP 89/57 06/24/24 11:30  Temp 35.8 C 06/24/24 11:30  Pulse 85 06/24/24 11:30  Resp 12 06/24/24 11:32  SpO2 99 % 06/24/24 11:30  Vitals shown include unfiled device data.  Last Pain:  Vitals:   06/24/24 1130  TempSrc: Temporal  PainSc: Asleep         Complications: No notable events documented.

## 2024-06-29 LAB — SURGICAL PATHOLOGY

## 2024-06-30 ENCOUNTER — Encounter: Payer: Self-pay | Admitting: Physician Assistant

## 2024-06-30 DIAGNOSIS — B9681 Helicobacter pylori [H. pylori] as the cause of diseases classified elsewhere: Secondary | ICD-10-CM | POA: Insufficient documentation

## 2024-06-30 DIAGNOSIS — Z860101 Personal history of adenomatous and serrated colon polyps: Secondary | ICD-10-CM | POA: Insufficient documentation

## 2024-06-30 DIAGNOSIS — K2980 Duodenitis without bleeding: Secondary | ICD-10-CM | POA: Insufficient documentation

## 2024-07-14 ENCOUNTER — Ambulatory Visit (INDEPENDENT_AMBULATORY_CARE_PROVIDER_SITE_OTHER): Admitting: Emergency Medicine

## 2024-07-14 VITALS — Ht 69.0 in | Wt 219.0 lb

## 2024-07-14 DIAGNOSIS — Z0000 Encounter for general adult medical examination without abnormal findings: Secondary | ICD-10-CM | POA: Diagnosis not present

## 2024-07-14 NOTE — Progress Notes (Signed)
 Chief Complaint  Patient presents with   Medicare Wellness     Subjective:   Richard [name] is a 73 y.o. male who presents for a Medicare Annual Wellness Visit.  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   COLONOSCOPY N/A 06/24/2024   Procedure: COLONOSCOPY;  Surgeon: Onita Elspeth Ozell, DO;  Location[facility] ENDOSCOPY;  Service: Gastroenterology;  Laterality: N/A;   ESOPHAGOGASTRODUODENOSCOPY N/A 06/24/2024   Procedure: EGD (ESOPHAGOGASTRODUODENOSCOPY);  Surgeon: Onita Elspeth Ozell, DO;  Location: [facility] ENDOSCOPY;  Service: Gastroenterology;  Laterality: N/A;   NO PAST SURGERIES     POLYPECTOMY  06/24/2024   Procedure: POLYPECTOMY, INTESTINE;  Surgeon: Onita Elspeth Ozell, DO;  Location: [facility] ENDOSCOPY;  Service: Gastroenterology;;   RIGHT/LEFT HEART CATH AND CORONARY ANGIOGRAPHY Bilateral 06/20/2020   Procedure: RIGHT/LEFT HEART CATH AND CORONARY ANGIOGRAPHY;  Surgeon: Fernand Denyse LABOR, MD;  Location: [facility] INVASIVE CV LAB;  Service: Cardiovascular;  Laterality: Bilateral;   Family History  Problem Relation Age of Onset   Colon cancer Mother    Hyperlipidemia Mother    Rectal cancer Mother    Other Father        unknown medical istory   Social History   Occupational History   Not on file  Tobacco Use   Smoking status: Never   Smokeless tobacco: Former    Types: Chew, Snuff    Quit date: 2021  Vaping Use   Vaping status: Never Used  Substance and Sexual Activity   Alcohol use: Not Currently    Comment: 1 drink monthly, currently not drinking  due to Alpha Gal   Drug use: Never   Sexual activity: Not Currently   Tobacco Counseling Counseling given: Not Answered  SDOH Screenings   Food Insecurity: No Food Insecurity (07/14/2024)  Housing: Low Risk  (07/14/2024)  Transportation Needs: No Transportation Needs (07/14/2024)  Utilities: Not At Risk (07/14/2024)  Alcohol Screen: Low Risk  (07/13/2024)  Depression (PHQ2-9): Low Risk  (07/14/2024)  Financial Resource Strain: Low Risk  (07/13/2024)  Physical Activity: Insufficiently Active (07/14/2024)  Social Connections: Moderately Integrated (07/14/2024)  Stress: No Stress Concern Present (07/14/2024)  Tobacco Use: Medium Risk (07/14/2024)  Health Literacy: Adequate Health Literacy (07/14/2024)   See flowsheets for full screening details  Depression Screen PHQ 2 & 9 Depression Scale- Over the past 2 weeks, how often have you been bothered by any of the following problems? Little interest or pleasure in doing things: 0 Feeling down, depressed, or hopeless (PHQ Adolescent also includes...irritable): 0 PHQ-2 Total Score: 0 Trouble falling or staying asleep, or sleeping too much: 0 Feeling tired or having little energy: 0 Poor appetite or overeating (PHQ Adolescent also includes...weight loss): 0 Feeling bad about yourself - or that you are a failure or have let yourself or your family down: 0 Trouble concentrating on things, such as reading the newspaper or watching television (PHQ Adolescent also includes...like school work): 0 Moving or speaking so slowly that other people could have noticed. Or the opposite - being so fidgety or restless that you have been moving around a lot  more than usual: 0 Thoughts that you would be better off dead, or of hurting yourself in some way: 0 PHQ-9 Total Score: 0 If you checked off any problems, how difficult have these problems made it for you to do your work, take care of things at home, or get along with other people?: Not difficult at  all  Depression Treatment Depression Interventions/Treatment : EYV7-0 Score <4 Follow-up Not Indicated     Goals Addressed               This Visit's Progress     Getting GI issues resolved (pt-stated)         Visit info / Clinical Intake: Medicare Wellness Visit Type:: Initial Annual Wellness Visit Persons participating in visit:: patient Medicare Wellness Visit Mode:: Telephone If telephone:: video declined Because this visit was a virtual/telehealth visit:: pt reported vitals If Telephone or Video please confirm:: I connected with the patient using audio enabled telemedicine application and verified that I am speaking with the correct person using two identifiers; I discussed the limitations of evaluation and management by telemedicine; The patient expressed understanding and agreed to proceed Patient Location:: home Provider Location:: office/clinic Information given by:: patient Interpreter Needed?: No Pre-visit prep was completed: yes AWV questionnaire completed by patient prior to visit?: yes Date:: 07/13/24 Living arrangements:: lives with spouse/significant other Patient's Overall Health Status Rating: good Typical amount of pain: none Does pain affect daily life?: no Are you currently prescribed opioids?: no  Dietary Habits and Nutritional Risks How many meals a day?: 2 Eats fruit and vegetables daily?: yes Most meals are obtained by: having others provide food (wife prepares meals) In the last 2 weeks, have you had any of the following?: (!) nausea, vomiting, diarrhea (due to current treatment of H. Pylori) Diabetic:: no  Functional Status Activities of Daily Living (to include ambulation/medication): Independent Ambulation: Independent with device- listed below Home Assistive Devices/Equipment: Contact lenses Medication Administration: Independent Home Management: Independent Manage your own finances?: yes Primary transportation is: driving Concerns  about vision?: no *vision screening is required for WTM* Concerns about hearing?: no  Fall Screening Falls in the past year?: 0 Number of falls in past year: 0 Was there an injury with Fall?: 0 Fall Risk Category Calculator: 0 Patient Fall Risk Level: Low Fall Risk  Fall Risk Patient at Risk for Falls Due to: No Fall Risks Fall risk Follow up: Falls evaluation completed  Home and Transportation Safety: All rugs have non-skid backing?: yes All stairs or steps have railings?: yes Grab bars in the bathtub or shower?: (!) no Have non-skid surface in bathtub or shower?: yes Good home lighting?: yes Regular seat belt use?: yes Hospital stays in the last year:: no  Cognitive Assessment Difficulty concentrating, remembering, or making decisions? : no Will 6CIT or Mini Cog be Completed: yes What year is it?: 0 points What month is it?: 0 points Give patient an address phrase to remember (5 components): [address] [place] About what time is it?: 0 points Count backwards from 20 to 1: 0 points Say the months of the year in reverse: 0 points Repeat the address phrase from earlier: 0 points 6 CIT Score: 0 points  Advance Directives (For Healthcare) Does Patient Have a Medical Advance Directive?: No Would patient like information on creating a medical advance directive?: Yes (MAU/Ambulatory/Procedural Areas - Information given)  Reviewed/Updated  Reviewed/Updated: Reviewed All (Medical, Surgical, Family, Medications, Allergies, Care Teams, Patient Goals)  Objective:    Today's Vitals   07/14/24 0847  Weight: 219 lb (99.3 kg)  Height: 5' 9 (1.753 m)   Body mass index is 32.34 kg/m.  Current Medications (verified) Outpatient Encounter Medications as of 07/14/2024  Medication Sig   amLODipine  (NORVASC ) 5 MG tablet Take 1 tablet (5 mg total) by mouth daily.   amoxicillin  (AMOXIL ) 500 MG capsule Take 4 capsules (2,000 mg total) by mouth as directed. Take 4 tab 1  hour prior to dental work   Bismuth Subsalicylate (PEPTO-BISMOL) 262 MG TABS Take 2 tablets by mouth. For 14 days   cetirizine (ZYRTEC) 10 MG chewable tablet Chew 10 mg by mouth in the morning and at bedtime.   Cholecalciferol (VITAMIN D3) 50 MCG (2000 UT) TABS Take 2,000 Units by mouth 3 (three) times a week.   EPINEPHrine  0.3 mg/0.3 mL IJ SOAJ injection Inject 0.3 mg into the muscle as needed for anaphylaxis.   famotidine (PEPCID) 10 MG tablet Take 10 mg by mouth 2 (two) times daily.   losartan -hydrochlorothiazide (HYZAAR) 100-12.5 MG tablet TAKE 1 TABLET BY MOUTH EVERY DAY   metoprolol  succinate (TOPROL -XL) 50 MG 24 hr tablet TAKE 1 TABLET BY MOUTH EVERY DAY WITH OR IMMEDIATELY FOLLOWING A MEAL   metroNIDAZOLE (FLAGYL) 500 MG tablet Take 500 mg by mouth 4 (four) times daily. For 14 days   omeprazole (PRILOSEC) 40 MG capsule Take 40 mg by mouth in the morning and at bedtime. For 14 days   rosuvastatin  (CRESTOR ) 10 MG tablet Take 1 tablet (10 mg total) by mouth daily.   spironolactone  (ALDACTONE ) 25 MG tablet TAKE 1 TABLET (25 MG TOTAL) BY MOUTH DAILY.   tetracycline (SUMYCIN) 500 MG capsule Take 500 mg by mouth 4 (four) times daily. For 14 days   aspirin  81 MG chewable tablet Chew by mouth. (Patient not taking: Reported on 07/14/2024)   Coenzyme Q10 (CO Q 10) 100 MG CAPS Take 100 mg by mouth daily. (Patient not taking: Reported on 07/14/2024)   cromolyn (GASTROCROM) 100 MG/5ML solution Take 200 mg by mouth 4 (four) times daily. (Patient not taking: Reported on 07/14/2024)   No facility-administered encounter medications on file as of 07/14/2024.   Hearing/Vision screen Hearing Screening - Comments:: Denies hearing loss  Vision Screening - Comments:: UTD @ [facility] in [place], Dr. Lynwood Hasting Immunizations and Health Maintenance Health Maintenance  Topic Date Due   Hepatitis C Screening  Never done   Pneumococcal Vaccine: 50+ Years (1 of 2 - PCV) Never done   Zoster Vaccines-  Shingrix (1 of 2) Never done   COVID-19 Vaccine (1 - 2025-26 season) Never done   Influenza Vaccine  11/23/2024 (Originally 03/26/2024)   DTaP/Tdap/Td (2 - Td or Tdap) 02/21/2025   Medicare Annual Wellness (AWV)  07/14/2025   Colonoscopy  06/24/2028   Meningococcal B Vaccine  Aged Out        Assessment/Plan:  This is a routine wellness examination for Richard [name].  Patient Care Team: Manya Toribio SQUIBB, PA as PCP - General (Physician Assistant) Fernand Denyse LABOR, MD as Consulting Physician (Cardiology) Hasting Lynwood, OD (Optometry) [place], [place] (Allergy  and Immunology) Onita Elspeth Ozell, DO as Consulting Physician (Gastroenterology)  I have personally reviewed and noted the following in the patient's chart:   Medical and social history Use of alcohol, tobacco or illicit drugs  Current medications and supplements including opioid prescriptions. Functional ability and status Nutritional status Physical activity Advanced directives List of other physicians Hospitalizations, surgeries, and ER visits  in previous 12 months Vitals Screenings to include cognitive, depression, and falls Referrals and appointments  No orders of the defined types were placed in this encounter.  In addition, I have reviewed and discussed with patient certain preventive protocols, quality metrics, and best practice recommendations. A written personalized care plan for preventive services as well as general preventive health recommendations were provided to patient.   Vina Ned, CMA   07/14/2024   Return in 1 year (on 07/28/2025).  After Visit Summary: (MyChart) Due to this being a telephonic visit, the after visit summary with patients personalized plan was offered to patient via MyChart   Nurse Notes:  Declined flu, covid, pneumonia and shingles vaccines.

## 2024-07-14 NOTE — Patient Instructions (Signed)
 [name],  Thank you for taking the time for your Medicare Wellness Visit. I appreciate your continued commitment to your health goals. Please review the care plan we discussed, and feel free to reach out if I can assist you further.  Please note that Annual Wellness Visits do not include a physical exam. Some assessments may be limited, especially if the visit was conducted virtually. If needed, we may recommend an in-person follow-up with your provider.  Ongoing Care Seeing your primary care provider every 3 to 6 months helps us  monitor your health and provide consistent, personalized care.   Referrals If a referral was made during today's visit and you haven't received any updates within two weeks, please contact the referred provider directly to check on the status.  Recommended Screenings: Keep up the good work!  Health Maintenance  Topic Date Due   Medicare Annual Wellness Visit  Never done   Hepatitis C Screening  Never done   Pneumococcal Vaccine for age over 23 (1 of 2 - PCV) Never done   Zoster (Shingles) Vaccine (1 of 2) Never done   Flu Shot  Never done   COVID-19 Vaccine (1 - 2025-26 season) Never done   DTaP/Tdap/Td vaccine (2 - Td or Tdap) 02/21/2025   Colon Cancer Screening  06/24/2028   Meningitis B Vaccine  Aged Out       07/14/2024    8:58 AM  Advanced Directives  Does Patient Have a Medical Advance Directive? No  Would patient like information on creating a medical advance directive? Yes (MAU/Ambulatory/Procedural Areas - Information given)   Information on Advanced Care Planning can be found at [place]  Secretary of [facility] Advance Health Care Directives Advance Health Care Directives ([email or web address]  You may also get the forms at your doctor's office.  Vision: Annual vision screenings are recommended for early detection of glaucoma, cataracts, and diabetic retinopathy. These exams can also reveal signs of chronic conditions such as diabetes and high blood  pressure.  Dental: Annual dental screenings help detect early signs of oral cancer, gum disease, and other conditions linked to overall health, including heart disease and diabetes.  Please see the attached documents for additional preventive care recommendations.   Fall Prevention in the Home, Adult Falls can cause injuries and affect people of all ages. There are many simple things that you can do to make your home safe and to help prevent falls. If you need it, ask for help making these changes. What actions can I take to prevent falls? General information Use good lighting in all rooms. Make sure to: Replace any light bulbs that burn out. Turn on lights if it is dark and use night-lights. Keep items that you use often in easy-to-reach places. Lower the shelves around your home if needed. Move furniture so that there are clear paths around it. Do not keep throw rugs or other things on the floor that can make you trip. If any of your floors are uneven, fix them. Add color or contrast paint or tape to clearly mark and help you see: Grab bars or handrails. First and last steps of staircases. Where the edge of each step is. If you use a ladder or stepladder: Make sure that it is fully opened. Do not climb a closed ladder. Make sure the sides of the ladder are locked in place. Have someone hold the ladder while you use it. Know where your pets are as you move through your home. What can I do in  the bathroom?     Keep the floor dry. Clean up any water that is on the floor right away. Remove soap buildup in the bathtub or shower. Buildup makes bathtubs and showers slippery. Use non-skid mats or decals on the floor of the bathtub or shower. Attach bath mats securely with double-sided, non-slip rug tape. If you need to sit down while you are in the shower, use a non-slip stool. Install grab bars by the toilet and in the bathtub and shower. Do not use towel bars as grab bars. What can I  do in the bedroom? Make sure that you have a light by your bed that is easy to reach. Do not use any sheets or blankets on your bed that hang to the floor. Have a firm bench or chair with side arms that you can use for support when you get dressed. What can I do in the kitchen? Clean up any spills right away. If you need to reach something above you, use a sturdy step stool that has a grab bar. Keep electrical cables out of the way. Do not use floor polish or wax that makes floors slippery. What can I do with my stairs? Do not leave anything on the stairs. Make sure that you have a light switch at the top and the bottom of the stairs. Have them installed if you do not have them. Make sure that there are handrails on both sides of the stairs. Fix handrails that are broken or loose. Make sure that handrails are as long as the staircases. Install non-slip stair treads on all stairs in your home if they do not have carpet. Avoid having throw rugs at the top or bottom of stairs, or secure the rugs with carpet tape to prevent them from moving. Choose a carpet design that does not hide the edge of steps on the stairs. Make sure that carpet is firmly attached to the stairs. Fix any carpet that is loose or worn. What can I do on the outside of my home? Use bright outdoor lighting. Repair the edges of walkways and driveways and fix any cracks. Clear paths of anything that can make you trip, such as tools or rocks. Add color or contrast paint or tape to clearly mark and help you see high doorway thresholds. Trim any bushes or trees on the main path into your home. Check that handrails are securely fastened and in good repair. Both sides of all steps should have handrails. Install guardrails along the edges of any raised decks or porches. Have leaves, snow, and ice cleared regularly. Use sand, salt, or ice melt on walkways during winter months if you live where there is ice and snow. In the garage, clean  up any spills right away, including grease or oil spills. What other actions can I take? Review your medicines with your health care provider. Some medicines can make you confused or feel dizzy. This can increase your chance of falling. Wear closed-toe shoes that fit well and support your feet. Wear shoes that have rubber soles and low heels. Use a cane, walker, scooter, or crutches that help you move around if needed. Talk with your provider about other ways that you can decrease your risk of falls. This may include seeing a physical therapist to learn to do exercises to improve movement and strength. Where to find more information Centers for Disease Control and Prevention, STEADI: tonerpromos.no [employer] on Aging: baseringtones.pl National Institute on Aging: baseringtones.pl Contact a health  care provider if: You are afraid of falling at home. You feel weak, drowsy, or dizzy at home. You fall at home. Get help right away if you: Lose consciousness or have trouble moving after a fall. Have a fall that causes a head injury. These symptoms may be an emergency. Get help right away. Call 911. Do not wait to see if the symptoms will go away. Do not drive yourself to the hospital. This information is not intended to replace advice given to you by your health care provider. Make sure you discuss any questions you have with your health care provider. Document Revised: 04/15/2022 Document Reviewed: 04/15/2022 Elsevier Patient Education  2024 [employer].

## 2024-07-26 ENCOUNTER — Encounter: Payer: Self-pay | Admitting: Physician Assistant

## 2024-07-26 ENCOUNTER — Ambulatory Visit: Admitting: Physician Assistant

## 2024-07-26 VITALS — BP 118/74 | HR 59 | Temp 97.4°F | Ht 69.0 in | Wt 223.0 lb

## 2024-07-26 DIAGNOSIS — E782 Mixed hyperlipidemia: Secondary | ICD-10-CM | POA: Diagnosis not present

## 2024-07-26 DIAGNOSIS — I10 Essential (primary) hypertension: Secondary | ICD-10-CM

## 2024-07-26 NOTE — Assessment & Plan Note (Signed)
 Continue rosuvastatin  to 10 mg daily and repeat fasting lipid panel at his convenience

## 2024-07-26 NOTE — Progress Notes (Signed)
 Date:  07/26/2024   Name:  Richard [name]   DOB:  1951/05/20   MRN:  [id number]   Chief Complaint: Hypertension and Hyperlipidemia  HPI  Richard [name] returns for 45-month follow-up on chronic conditions including HTN and HLD.  He has been working with GI to evaluate his chronic GI complaints, recent colonoscopy with several polyps and endoscopy showing chronic gastritis and H. pylori infection.  He recently completed 2-week course of treatment for this.  Believes things to be improving.  He has still not completed fasting lipid panel ordered at our last visit.  He is not fasting today.  He reports an upcoming cardiology visit next week states he will complete his labs that day.  They have been considering dose decrease to one of his antihypertensives.  Continue with rosuvastatin .    Medication list has been reviewed and updated.  Current Meds  Medication Sig   amLODipine  (NORVASC ) 5 MG tablet Take 1 tablet (5 mg total) by mouth daily.   amoxicillin  (AMOXIL ) 500 MG capsule Take 4 capsules (2,000 mg total) by mouth as directed. Take 4 tab 1 hour prior to dental work   cetirizine (ZYRTEC) 10 MG chewable tablet Chew 10 mg by mouth in the morning and at bedtime.   Cholecalciferol (VITAMIN D3) 50 MCG (2000 UT) TABS Take 2,000 Units by mouth 3 (three) times a week.   EPINEPHrine  0.3 mg/0.3 mL IJ SOAJ injection Inject 0.3 mg into the muscle as needed for anaphylaxis.   famotidine (PEPCID) 10 MG tablet Take 10 mg by mouth 2 (two) times daily.   losartan -hydrochlorothiazide (HYZAAR) 100-12.5 MG tablet TAKE 1 TABLET BY MOUTH EVERY DAY   metoprolol  succinate (TOPROL -XL) 50 MG 24 hr tablet TAKE 1 TABLET BY MOUTH EVERY DAY WITH OR IMMEDIATELY FOLLOWING A MEAL   omeprazole (PRILOSEC) 40 MG capsule Take 40 mg by mouth in the morning and at bedtime. For 14 days   rosuvastatin  (CRESTOR ) 10 MG tablet Take 1 tablet (10 mg total) by mouth daily.   spironolactone  (ALDACTONE ) 25 MG tablet TAKE 1 TABLET (25 MG  TOTAL) BY MOUTH DAILY.     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   Primary hypertension 12/02/2022   Mixed hyperlipidemia 12/02/2022   History of aortic valve replacement 04/22/2022   Severe aortic stenosis 06/15/2020    Allergies  Allergen Reactions   Alpha-Gal Other (See Comments)    All Mammal Meat,   Glycerin Other (See Comments)    * mammal glycerin   Heparin  (Bovine) Other (See Comments)   Zetia [Ezetimibe] Photosensitivity and Rash    Immunization History  Administered Date(s) Administered   Tdap 02/22/2015    Past Surgical History:  Procedure Laterality Date   CARDIAC CATHETERIZATION     CARDIAC VALVE REPLACEMENT  08/01/2020   TAVR   COLONOSCOPY N/A 06/24/2024   Procedure: COLONOSCOPY;  Surgeon: Richard Elspeth Ozell, DO;  Location: [facility] ENDOSCOPY;  Service: Gastroenterology;  Laterality: N/A;   ESOPHAGOGASTRODUODENOSCOPY N/A 06/24/2024   Procedure: EGD (ESOPHAGOGASTRODUODENOSCOPY);  Surgeon: Richard Elspeth Ozell, DO;  Location: [facility] ENDOSCOPY;  Service: Gastroenterology;  Laterality: N/A;   NO PAST SURGERIES  POLYPECTOMY  06/24/2024   Procedure: POLYPECTOMY, INTESTINE;  Surgeon: Richard Elspeth Sharper, DO;  Location: [facility] ENDOSCOPY;  Service: Gastroenterology;;   RIGHT/LEFT HEART CATH AND CORONARY ANGIOGRAPHY Bilateral 06/20/2020   Procedure: RIGHT/LEFT HEART CATH AND CORONARY ANGIOGRAPHY;  Surgeon: Richard Denyse LABOR, MD;  Location: [facility] INVASIVE CV LAB;  Service: Cardiovascular;  Laterality: Bilateral;    Social History   Tobacco Use   Smoking status: Never   Smokeless  tobacco: Former    Types: Chew, Snuff    Quit date: 2021  Vaping Use   Vaping status: Never Used  Substance Use Topics   Alcohol use: Not Currently    Comment: 1 drink monthly, currently not drinking due to Alpha Gal   Drug use: Never    Family History  Problem Relation Age of Onset   Colon cancer Mother    Hyperlipidemia Mother    Rectal cancer Mother    Other Father        unknown medical istory        07/26/2024    9:55 AM 03/22/2024   10:13 AM 12/22/2023    1:24 PM  GAD 7 : Generalized Anxiety Score  Nervous, Anxious, on Edge 0 0 0  Control/stop worrying 0 0 0  Worry too much - different things 0 0 0  Trouble relaxing 0 0 0  Restless 0 0 0  Easily annoyed or irritable 0 0 0  Afraid - awful might happen 0 0 0  Total GAD 7 Score 0 0 0  Anxiety Difficulty Not difficult at all Not difficult at all Not difficult at all       07/26/2024    9:55 AM 07/14/2024    9:05 AM 03/22/2024   10:13 AM  Depression screen PHQ 2/9  Decreased Interest 0 0 0  Down, Depressed, Hopeless 0 0 0  PHQ - 2 Score 0 0 0  Altered sleeping  0 0  Tired, decreased energy  0 0  Change in appetite  0 0  Feeling bad or failure about yourself   0 0  Trouble concentrating  0 0  Moving slowly or fidgety/restless  0 0  Suicidal thoughts  0 0  PHQ-9 Score  0 0   Difficult doing work/chores  Not difficult at all Not difficult at all     Data saved with a previous flowsheet row definition    BP Readings from Last 3 Encounters:  07/26/24 118/74  06/24/24 113/79  05/11/24 118/70    Wt Readings from Last 3 Encounters:  07/26/24 223 lb (101.2 kg)  07/14/24 219 lb (99.3 kg)  06/24/24 221 lb (100.2 kg)    BP 118/74   Pulse (!) 59   Temp (!) 97.4 F (36.3 C)   Ht 5' 9 (1.753 m)   Wt 223 lb (101.2 kg)   SpO2 98%   BMI 32.93 kg/m   Physical Exam Vitals and nursing note reviewed.  Constitutional:      Appearance: Normal appearance.  Cardiovascular:     Rate and Rhythm: Normal rate and  regular rhythm.     Heart sounds: Murmur heard.     Systolic murmur is present with a grade of 2/6.     No friction rub. No gallop.  Pulmonary:     Effort: Pulmonary effort is normal.     Breath sounds: Normal breath sounds.  Abdominal:     General: There is no distension.  Musculoskeletal:        General: Normal  range of motion.  Skin:    General: Skin is warm and dry.  Neurological:     Mental Status: He is alert and oriented to person, place, and time.     Gait: Gait is intact.  Psychiatric:        Mood and Affect: Mood and affect normal.     Recent Labs     Component Value Date/Time   NA 134 12/24/2023 0812   K 4.5 12/24/2023 0812   CL 98 12/24/2023 0812   CO2 21 12/24/2023 0812   GLUCOSE 104 (H) 12/24/2023 0812   GLUCOSE 80 05/13/2023 1627   BUN 18 12/24/2023 0812   CREATININE 1.36 (H) 12/24/2023 0812   CALCIUM  9.5 12/24/2023 0812   PROT 7.0 12/24/2023 0812   ALBUMIN 4.2 12/24/2023 0812   AST 23 12/24/2023 0812   ALT 22 12/24/2023 0812   ALKPHOS 56 12/24/2023 0812   BILITOT 0.4 12/24/2023 0812   GFRNONAA >60 05/13/2023 1627   GFRAA >60 05/14/2020 1034    Lab Results  Component Value Date   WBC 6.0 12/24/2023   HGB 15.5 12/24/2023   HCT 46.1 12/24/2023   MCV 93 12/24/2023   PLT 250 12/24/2023   Lab Results  Component Value Date   HGBA1C 6.0 (H) 05/01/2023   Lab Results  Component Value Date   CHOL 327 (H) 12/24/2023   HDL 35 (L) 12/24/2023   LDLCALC 240 (H) 12/24/2023   TRIG 251 (H) 12/24/2023   CHOLHDL 9.3 (H) 12/24/2023   Lab Results  Component Value Date   TSH 1.560 05/01/2023      Assessment and Plan:  Primary hypertension Assessment & Plan: Well-controlled with current regimen, continue at the discretion of cardiology.  Continue home BP monitoring.  Repeat BMP with lipids  Orders: -     Basic metabolic panel with GFR  Mixed hyperlipidemia Assessment & Plan: Continue rosuvastatin  to 10 mg daily and repeat fasting lipid panel at  his convenience      Follow-up in 6 months OV  Rolan Hoyle, PA-C, DMSc, Nutritionist [facility] [phone]

## 2024-07-26 NOTE — Assessment & Plan Note (Addendum)
 Well-controlled with current regimen, continue at the discretion of cardiology.  Continue home BP monitoring.  Repeat BMP with lipids

## 2024-08-10 ENCOUNTER — Ambulatory Visit: Payer: Self-pay | Admitting: Physician Assistant

## 2024-08-10 LAB — BASIC METABOLIC PANEL WITH GFR
BUN/Creatinine Ratio: 10 (ref 10–24)
BUN: 13 mg/dL (ref 8–27)
CO2: 21 mmol/L (ref 20–29)
Calcium: 9.2 mg/dL (ref 8.6–10.2)
Chloride: 102 mmol/L (ref 96–106)
Creatinine, Ser: 1.31 mg/dL — ABNORMAL HIGH (ref 0.76–1.27)
Glucose: 107 mg/dL — ABNORMAL HIGH (ref 70–99)
Potassium: 4.5 mmol/L (ref 3.5–5.2)
Sodium: 137 mmol/L (ref 134–144)
eGFR: 58 mL/min/1.73 — ABNORMAL LOW (ref >59)

## 2024-08-11 LAB — SPECIMEN STATUS REPORT

## 2024-08-11 LAB — LIPID PANEL WITH LDL/HDL RATIO
Cholesterol, Total: 185 mg/dL (ref 100–199)
HDL: 39 mg/dL — ABNORMAL LOW (ref >39)
LDL Chol Calc (NIH): 112 mg/dL — ABNORMAL HIGH (ref 0–99)
LDL/HDL Ratio: 2.9 ratio (ref 0.0–3.6)
Triglycerides: 196 mg/dL — ABNORMAL HIGH (ref 0–149)
VLDL Cholesterol Cal: 34 mg/dL (ref 5–40)

## 2024-08-13 ENCOUNTER — Ambulatory Visit: Admitting: Cardiovascular Disease

## 2024-08-13 ENCOUNTER — Encounter: Payer: Self-pay | Admitting: Cardiovascular Disease

## 2024-08-13 VITALS — BP 118/74 | HR 67 | Ht 69.0 in | Wt 226.4 lb

## 2024-08-13 DIAGNOSIS — R002 Palpitations: Secondary | ICD-10-CM | POA: Diagnosis not present

## 2024-08-13 DIAGNOSIS — I351 Nonrheumatic aortic (valve) insufficiency: Secondary | ICD-10-CM

## 2024-08-13 DIAGNOSIS — I10 Essential (primary) hypertension: Secondary | ICD-10-CM

## 2024-08-13 DIAGNOSIS — Z952 Presence of prosthetic heart valve: Secondary | ICD-10-CM | POA: Diagnosis not present

## 2024-08-13 DIAGNOSIS — E782 Mixed hyperlipidemia: Secondary | ICD-10-CM

## 2024-08-13 NOTE — Progress Notes (Signed)
 "     Cardiology Office Note   Date:  08/13/2024   ID:  Richard [name], DOB Sep 30, 1950, MRN [id number]  PCP:  Manya Toribio SQUIBB, PA  Cardiologist:  Denyse Bathe, MD      History of Present Illness: Richard [name] is a 73 y.o. male who presents for  Chief Complaint  Patient presents with   Follow-up    3 month follow up    Has erosions in esophagus on EGD.      Past Medical History:  Diagnosis Date   Allergy     Aortic stenosis    GERD (gastroesophageal reflux disease) 07/27/2023   Heart murmur 04/26/2020   Hyperlipidemia    Hypertension    Mitral valve regurgitation      Past Surgical History:  Procedure Laterality Date   CARDIAC CATHETERIZATION     CARDIAC VALVE REPLACEMENT  08/01/2020   TAVR   COLONOSCOPY N/A 06/24/2024   Procedure: COLONOSCOPY;  Surgeon: Onita Elspeth Ozell, DO;  Location: [facility] ENDOSCOPY;  Service: Gastroenterology;  Laterality: N/A;   ESOPHAGOGASTRODUODENOSCOPY N/A 06/24/2024   Procedure: EGD (ESOPHAGOGASTRODUODENOSCOPY);  Surgeon: Onita Elspeth Ozell, DO;  Location: [facility] ENDOSCOPY;  Service: Gastroenterology;  Laterality: N/A;   NO PAST SURGERIES     POLYPECTOMY  06/24/2024   Procedure: POLYPECTOMY, INTESTINE;  Surgeon: Onita Elspeth Ozell, DO;  Location: [facility] ENDOSCOPY;  Service: Gastroenterology;;   RIGHT/LEFT HEART CATH AND CORONARY ANGIOGRAPHY Bilateral 06/20/2020   Procedure: RIGHT/LEFT HEART CATH AND CORONARY ANGIOGRAPHY;  Surgeon: Bathe Denyse LABOR, MD;  Location: [facility] INVASIVE CV LAB;  Service: Cardiovascular;  Laterality: Bilateral;     Current Outpatient Medications  Medication Sig Dispense Refill   amLODipine  (NORVASC ) 5 MG tablet Take 1 tablet (5 mg total) by mouth daily. 30 tablet 11   amoxicillin  (AMOXIL ) 500 MG capsule Take 4 capsules (2,000 mg total) by mouth as directed. Take 4 tab 1 hour prior to dental work 4 capsule 4   cetirizine (ZYRTEC) 10 MG chewable tablet Chew 10 mg by mouth in the morning and at bedtime.      Cholecalciferol (VITAMIN D3) 50 MCG (2000 UT) TABS Take 2,000 Units by mouth 3 (three) times a week.     EPINEPHrine  0.3 mg/0.3 mL IJ SOAJ injection Inject 0.3 mg into the muscle as needed for anaphylaxis. 1 each 1   famotidine (PEPCID) 10 MG tablet Take 10 mg by mouth 2 (two) times daily.     losartan -hydrochlorothiazide (HYZAAR) 100-12.5 MG tablet TAKE 1 TABLET BY MOUTH EVERY DAY 90 tablet 2   metoprolol  succinate (TOPROL -XL) 50 MG 24 hr tablet TAKE 1 TABLET BY MOUTH EVERY DAY WITH OR IMMEDIATELY FOLLOWING A MEAL 90 tablet 0   omeprazole (PRILOSEC) 40 MG capsule Take 40 mg by mouth in the morning and at bedtime. For 14 days     rosuvastatin  (CRESTOR ) 10 MG tablet Take 1 tablet (10 mg total) by mouth daily. 90 tablet 1   spironolactone  (ALDACTONE ) 25 MG tablet TAKE 1 TABLET (25 MG TOTAL) BY MOUTH DAILY. 90 tablet 3   cromolyn (GASTROCROM) 100 MG/5ML solution Take 200 mg by mouth 4 (four) times daily. (Patient not taking: Reported on 08/13/2024)     No current facility-administered medications for this visit.    Allergies:   Alpha-gal, Glycerin, Heparin  (bovine), and Zetia [ezetimibe]    Social History:   reports that he has never smoked. He quit smokeless tobacco use about 4 years ago.  His smokeless tobacco use included chew and snuff. He  reports that he does not currently use alcohol. He reports that he does not use drugs.   Family History:  family history includes Colon cancer in his mother; Hyperlipidemia in his mother; Other in his father; Rectal cancer in his mother.    ROS:     Review of Systems  Constitutional: Negative.   HENT: Negative.    Eyes: Negative.   Respiratory: Negative.    Gastrointestinal: Negative.   Genitourinary: Negative.   Musculoskeletal: Negative.   Skin: Negative.   Neurological: Negative.   Endo/Heme/Allergies: Negative.   Psychiatric/Behavioral: Negative.    All other systems reviewed and are negative.     All other systems are reviewed and  negative.    PHYSICAL EXAM: VS:  BP 118/74   Pulse 67   Ht 5' 9 (1.753 m)   Wt 226 lb 6.4 oz (102.7 kg)   SpO2 98%   BMI 33.43 kg/m  , BMI Body mass index is 33.43 kg/m. Last weight:  Wt Readings from Last 3 Encounters:  08/13/24 226 lb 6.4 oz (102.7 kg)  07/26/24 223 lb (101.2 kg)  07/14/24 219 lb (99.3 kg)     Physical Exam Vitals reviewed.  Constitutional:      Appearance: Normal appearance. He is normal weight.  HENT:     Head: Normocephalic.     Nose: Nose normal.     Mouth/Throat:     Mouth: Mucous membranes are moist.  Eyes:     Pupils: Pupils are equal, round, and reactive to light.  Cardiovascular:     Rate and Rhythm: Normal rate and regular rhythm.     Pulses: Normal pulses.     Heart sounds: Normal heart sounds.  Pulmonary:     Effort: Pulmonary effort is normal.  Abdominal:     General: Abdomen is flat. Bowel sounds are normal.  Musculoskeletal:        General: Normal range of motion.     Cervical back: Normal range of motion.  Skin:    General: Skin is warm.  Neurological:     General: No focal deficit present.     Mental Status: He is alert.  Psychiatric:        Mood and Affect: Mood normal.       EKG:   Recent Labs: 12/24/2023: ALT 22; Hemoglobin 15.5; Magnesium 2.3; Platelets 250 08/09/2024: BUN 13; Creatinine, Ser 1.31; Potassium 4.5; Sodium 137    Lipid Panel    Component Value Date/Time   CHOL 185 08/09/2024 0847   TRIG 196 (H) 08/09/2024 0847   HDL 39 (L) 08/09/2024 0847   CHOLHDL 9.3 (H) 12/24/2023 0812   CHOLHDL 8.0 05/14/2020 1034   VLDL 37 05/14/2020 1034   LDLCALC 112 (H) 08/09/2024 0847      Other studies Reviewed: Additional studies/ records that were reviewed today include:  Review of the above records demonstrates:       No data to display            ASSESSMENT AND PLAN:    ICD-10-CM   1. Primary hypertension  I10    BP Stable    2. Mixed hyperlipidemia  E78.2     3. Aortic valve insufficiency,  etiology of cardiac valve disease unspecified  I35.1     4. Palpitation  R00.2     5. S/P AVR  Z95.2    Has moderate AR on echo 6/25       Problem List Items Addressed This Visit  Cardiovascular and Mediastinum   Primary hypertension - Primary     Other   Mixed hyperlipidemia   Other Visit Diagnoses       Aortic valve insufficiency, etiology of cardiac valve disease unspecified         Palpitation         S/P AVR       Has moderate AR on echo 6/25          Disposition:   Return in about 3 months (around 11/11/2024).    Total time spent: 35 minutes  Signed,  Denyse Bathe, MD  08/13/2024 9:29 AM    [facility] "

## 2024-09-05 ENCOUNTER — Other Ambulatory Visit: Payer: Self-pay | Admitting: Physician Assistant

## 2024-09-05 DIAGNOSIS — E782 Mixed hyperlipidemia: Secondary | ICD-10-CM

## 2024-09-06 NOTE — Telephone Encounter (Signed)
 Requested Prescriptions  Pending Prescriptions Disp Refills   rosuvastatin  (CRESTOR ) 10 MG tablet [Pharmacy Med Name: ROSUVASTATIN  CALCIUM  10 MG TAB] 90 tablet 3    Sig: TAKE 1 TABLET BY MOUTH EVERY DAY     Cardiovascular:  Antilipid - Statins 2 Failed - 09/06/2024  5:40 PM      Failed - Cr in normal range and within 360 days    Creatinine, Ser  Date Value Ref Range Status  08/09/2024 1.31 (H) 0.76 - 1.27 mg/dL Final         Failed - Lipid Panel in normal range within the last 12 months    Cholesterol, Total  Date Value Ref Range Status  08/09/2024 185 100 - 199 mg/dL Final   LDL Chol Calc (NIH)  Date Value Ref Range Status  08/09/2024 112 (H) 0 - 99 mg/dL Final   HDL  Date Value Ref Range Status  08/09/2024 39 (L) >39 mg/dL Final   Triglycerides  Date Value Ref Range Status  08/09/2024 196 (H) 0 - 149 mg/dL Final         Passed - Patient is not pregnant      Passed - Valid encounter within last 12 months    Recent Outpatient Visits           1 month ago Primary hypertension   [place] [facility] at [facility], Toribio SQUIBB, PA   5 months ago Allergy  to alpha-gal   [facility] & [facility] Medicine at [facility], Toribio SQUIBB, [place]   8 months ago Abdominal pain, unspecified abdominal location   [facility] Primary [facility] & [facility] Medicine at [facility], Toribio SQUIBB, [place]       Future Appointments             In 2 months Fernand Denyse LABOR, MD [facility]

## 2024-09-17 ENCOUNTER — Other Ambulatory Visit: Payer: Self-pay | Admitting: Cardiovascular Disease

## 2024-09-17 DIAGNOSIS — E782 Mixed hyperlipidemia: Secondary | ICD-10-CM

## 2024-09-17 DIAGNOSIS — I350 Nonrheumatic aortic (valve) stenosis: Secondary | ICD-10-CM

## 2024-09-17 DIAGNOSIS — I10 Essential (primary) hypertension: Secondary | ICD-10-CM

## 2024-09-17 DIAGNOSIS — Z952 Presence of prosthetic heart valve: Secondary | ICD-10-CM

## 2024-11-11 ENCOUNTER — Ambulatory Visit: Admitting: Cardiovascular Disease

## 2025-01-24 ENCOUNTER — Ambulatory Visit: Admitting: Physician Assistant

## 2025-07-28 ENCOUNTER — Ambulatory Visit
